# Patient Record
Sex: Male | Born: 1945 | Race: White | Hispanic: No | Marital: Single
Health system: Southern US, Community
[De-identification: ages and names within clinical notes are randomized; demographics above are authoritative.]

## PROBLEM LIST (undated history)

## (undated) DIAGNOSIS — J181 Lobar pneumonia, unspecified organism: Secondary | ICD-10-CM

## (undated) DIAGNOSIS — R1310 Dysphagia, unspecified: Secondary | ICD-10-CM

## (undated) DIAGNOSIS — G9341 Metabolic encephalopathy: Secondary | ICD-10-CM

## (undated) DIAGNOSIS — N170 Acute kidney failure with tubular necrosis: Secondary | ICD-10-CM

## (undated) DIAGNOSIS — J9621 Acute and chronic respiratory failure with hypoxia: Secondary | ICD-10-CM

## (undated) DIAGNOSIS — I63512 Cerebral infarction due to unspecified occlusion or stenosis of left middle cerebral artery: Secondary | ICD-10-CM

---

## 2018-11-07 ENCOUNTER — Other Ambulatory Visit (HOSPITAL_COMMUNITY): Payer: Medicare Other

## 2018-11-07 ENCOUNTER — Inpatient Hospital Stay
Admission: RE | Admit: 2018-11-07 | Discharge: 2018-12-04 | Disposition: A | Discharge status: Skilled Nursing Facility | Payer: Medicare Other | Source: Other Acute Inpatient Hospital | Attending: Internal Medicine | Admitting: Internal Medicine

## 2018-11-07 DIAGNOSIS — G9341 Metabolic encephalopathy: Secondary | ICD-10-CM | POA: Diagnosis present

## 2018-11-07 DIAGNOSIS — J398 Other specified diseases of upper respiratory tract: Secondary | ICD-10-CM

## 2018-11-07 DIAGNOSIS — J9690 Respiratory failure, unspecified, unspecified whether with hypoxia or hypercapnia: Secondary | ICD-10-CM

## 2018-11-07 DIAGNOSIS — R1310 Dysphagia, unspecified: Secondary | ICD-10-CM

## 2018-11-07 DIAGNOSIS — J181 Lobar pneumonia, unspecified organism: Secondary | ICD-10-CM | POA: Diagnosis present

## 2018-11-07 DIAGNOSIS — J189 Pneumonia, unspecified organism: Secondary | ICD-10-CM

## 2018-11-07 DIAGNOSIS — J9621 Acute and chronic respiratory failure with hypoxia: Secondary | ICD-10-CM | POA: Diagnosis present

## 2018-11-07 DIAGNOSIS — I63512 Cerebral infarction due to unspecified occlusion or stenosis of left middle cerebral artery: Secondary | ICD-10-CM | POA: Diagnosis present

## 2018-11-07 DIAGNOSIS — N170 Acute kidney failure with tubular necrosis: Secondary | ICD-10-CM | POA: Diagnosis present

## 2018-11-07 DIAGNOSIS — Z931 Gastrostomy status: Secondary | ICD-10-CM

## 2018-11-07 HISTORY — DX: Dysphagia, unspecified: R13.10

## 2018-11-07 HISTORY — DX: Cerebral infarction due to unspecified occlusion or stenosis of left middle cerebral artery: I63.512

## 2018-11-07 HISTORY — DX: Lobar pneumonia, unspecified organism: J18.1

## 2018-11-07 HISTORY — DX: Metabolic encephalopathy: G93.41

## 2018-11-07 HISTORY — DX: Acute and chronic respiratory failure with hypoxia: J96.21

## 2018-11-07 HISTORY — DX: Acute kidney failure with tubular necrosis: N17.0

## 2018-11-07 LAB — URINALYSIS, ROUTINE W REFLEX MICROSCOPIC
BILIRUBIN URINE: NEGATIVE
Glucose, UA: NEGATIVE mg/dL
Ketones, ur: NEGATIVE mg/dL
LEUKOCYTES UA: NEGATIVE
Nitrite: NEGATIVE
Protein, ur: NEGATIVE mg/dL
Specific Gravity, Urine: 1.012 (ref 1.005–1.030)
pH: 8 (ref 5.0–8.0)

## 2018-11-07 LAB — HEPARIN LEVEL (UNFRACTIONATED)
HEPARIN UNFRACTIONATED: 0.4 [IU]/mL (ref 0.30–0.70)
Heparin Unfractionated: 0.43 IU/mL (ref 0.30–0.70)

## 2018-11-07 LAB — CBC WITH DIFFERENTIAL/PLATELET
ABS IMMATURE GRANULOCYTES: 0.14 10*3/uL — AB (ref 0.00–0.07)
Basophils Absolute: 0 10*3/uL (ref 0.0–0.1)
Basophils Relative: 0 %
Eosinophils Absolute: 0.3 10*3/uL (ref 0.0–0.5)
Eosinophils Relative: 3 %
HCT: 31.3 % — ABNORMAL LOW (ref 39.0–52.0)
Hemoglobin: 10.2 g/dL — ABNORMAL LOW (ref 13.0–17.0)
Immature Granulocytes: 2 %
Lymphocytes Relative: 12 %
Lymphs Abs: 1 10*3/uL (ref 0.7–4.0)
MCH: 32.6 pg (ref 26.0–34.0)
MCHC: 32.6 g/dL (ref 30.0–36.0)
MCV: 100 fL (ref 80.0–100.0)
Monocytes Absolute: 0.7 10*3/uL (ref 0.1–1.0)
Monocytes Relative: 8 %
Neutro Abs: 6.2 10*3/uL (ref 1.7–7.7)
Neutrophils Relative %: 75 %
Platelets: 151 10*3/uL (ref 150–400)
RBC: 3.13 MIL/uL — ABNORMAL LOW (ref 4.22–5.81)
RDW: 13.2 % (ref 11.5–15.5)
WBC: 8.3 10*3/uL (ref 4.0–10.5)
nRBC: 0 % (ref 0.0–0.2)

## 2018-11-07 LAB — PROTIME-INR
INR: 1.19
Prothrombin Time: 15 seconds (ref 11.4–15.2)

## 2018-11-07 LAB — APTT: aPTT: 132 seconds — ABNORMAL HIGH (ref 24–36)

## 2018-11-07 MED ORDER — IOPAMIDOL (ISOVUE-300) INJECTION 61%: 50.0000 mL | Freq: Once | INTRAVENOUS | Status: DC | PRN

## 2018-11-07 MED ORDER — IOPAMIDOL (ISOVUE-300) INJECTION 61%
INTRAVENOUS | Status: AC
  Administered 2018-11-07: 50 mL via GASTROSTOMY
  Filled 2018-11-07: qty 50

## 2018-11-08 DIAGNOSIS — G9341 Metabolic encephalopathy: Secondary | ICD-10-CM

## 2018-11-08 DIAGNOSIS — R1310 Dysphagia, unspecified: Secondary | ICD-10-CM

## 2018-11-08 DIAGNOSIS — J9621 Acute and chronic respiratory failure with hypoxia: Secondary | ICD-10-CM | POA: Diagnosis not present

## 2018-11-08 DIAGNOSIS — I63512 Cerebral infarction due to unspecified occlusion or stenosis of left middle cerebral artery: Secondary | ICD-10-CM

## 2018-11-08 DIAGNOSIS — J181 Lobar pneumonia, unspecified organism: Secondary | ICD-10-CM

## 2018-11-08 DIAGNOSIS — N170 Acute kidney failure with tubular necrosis: Secondary | ICD-10-CM

## 2018-11-08 LAB — COMPREHENSIVE METABOLIC PANEL
ALT: 268 U/L — ABNORMAL HIGH (ref 0–44)
AST: 140 U/L — AB (ref 15–41)
Albumin: 2.4 g/dL — ABNORMAL LOW (ref 3.5–5.0)
Alkaline Phosphatase: 79 U/L (ref 38–126)
Anion gap: 10 (ref 5–15)
BUN: 11 mg/dL (ref 8–23)
CO2: 22 mmol/L (ref 22–32)
Calcium: 8.4 mg/dL — ABNORMAL LOW (ref 8.9–10.3)
Chloride: 103 mmol/L (ref 98–111)
Creatinine, Ser: 0.83 mg/dL (ref 0.61–1.24)
GFR calc Af Amer: >60 mL/min (ref >60)
GFR calc non Af Amer: >60 mL/min (ref >60)
Glucose, Bld: 128 mg/dL — ABNORMAL HIGH (ref 70–99)
Potassium: 4.2 mmol/L (ref 3.5–5.1)
Sodium: 135 mmol/L (ref 135–145)
Total Bilirubin: 0.6 mg/dL (ref 0.3–1.2)
Total Protein: 5.8 g/dL — ABNORMAL LOW (ref 6.5–8.1)

## 2018-11-08 LAB — CBC WITH DIFFERENTIAL/PLATELET
Abs Immature Granulocytes: 0.12 10*3/uL — ABNORMAL HIGH (ref 0.00–0.07)
Basophils Absolute: 0 10*3/uL (ref 0.0–0.1)
Basophils Relative: 0 %
Eosinophils Absolute: 0.2 10*3/uL (ref 0.0–0.5)
Eosinophils Relative: 2 %
HCT: 31.2 % — ABNORMAL LOW (ref 39.0–52.0)
Hemoglobin: 10 g/dL — ABNORMAL LOW (ref 13.0–17.0)
IMMATURE GRANULOCYTES: 1 %
LYMPHS PCT: 10 %
Lymphs Abs: 1 10*3/uL (ref 0.7–4.0)
MCH: 32.3 pg (ref 26.0–34.0)
MCHC: 32.1 g/dL (ref 30.0–36.0)
MCV: 100.6 fL — ABNORMAL HIGH (ref 80.0–100.0)
Monocytes Absolute: 0.8 10*3/uL (ref 0.1–1.0)
Monocytes Relative: 9 %
Neutro Abs: 7.5 10*3/uL (ref 1.7–7.7)
Neutrophils Relative %: 78 %
Platelets: 156 10*3/uL (ref 150–400)
RBC: 3.1 MIL/uL — ABNORMAL LOW (ref 4.22–5.81)
RDW: 13.2 % (ref 11.5–15.5)
WBC: 9.7 10*3/uL (ref 4.0–10.5)
nRBC: 0 % (ref 0.0–0.2)

## 2018-11-08 LAB — BLOOD GAS, ARTERIAL
Acid-Base Excess: 1.7 mmol/L (ref 0.0–2.0)
Bicarbonate: 24.7 mmol/L (ref 20.0–28.0)
FIO2: 28
O2 Saturation: 96.3 %
PEEP: 5 cmH2O
Patient temperature: 98.6
Pressure control: 22 cmH2O
RATE: 12 resp/min
pCO2 arterial: 31.8 mmHg — ABNORMAL LOW (ref 32.0–48.0)
pH, Arterial: 7.501 — ABNORMAL HIGH (ref 7.350–7.450)
pO2, Arterial: 73 mmHg — ABNORMAL LOW (ref 83.0–108.0)

## 2018-11-08 LAB — PROTIME-INR
INR: 1.15
Prothrombin Time: 14.6 seconds (ref 11.4–15.2)

## 2018-11-08 LAB — HEPARIN LEVEL (UNFRACTIONATED): Heparin Unfractionated: 0.28 IU/mL — ABNORMAL LOW (ref 0.30–0.70)

## 2018-11-08 LAB — HEMOGLOBIN A1C
Hgb A1c MFr Bld: 4.8 % (ref 4.8–5.6)
Mean Plasma Glucose: 91.06 mg/dL

## 2018-11-08 LAB — MAGNESIUM: Magnesium: 2.1 mg/dL (ref 1.7–2.4)

## 2018-11-08 LAB — TSH: TSH: 7.869 u[IU]/mL — ABNORMAL HIGH (ref 0.350–4.500)

## 2018-11-08 LAB — PHOSPHORUS: Phosphorus: 4.7 mg/dL — ABNORMAL HIGH (ref 2.5–4.6)

## 2018-11-08 NOTE — Consult Note (Signed)
Pulmonary Critical Care Medicine [facility] [place]  PULMONARY SERVICE  Date of Service: 11/08/2018  PULMONARY CRITICAL CARE CONSULT   Brian Chung  JXB:[id number]  DOB: 07-13-1946   DOA: 11/07/2018  Referring Physician: Carron Curie, MD  HPI: Brian Chung is a 72 y.o. male seen for follow up of Acute on Chronic Respiratory Failure.  Patient is transferred for further management basically has a history of hypothyroidism heart murmur hepatitis C anemia presented to the regional facility with a code stroke.  Patient had apparently been having difficulty controlling his right arm.  He also at the time was noted that he was able to speak however he did not feel right.  In the morning patient woke up and his friends found him flaccid and he was not able to peak.  Patient had a CT scan of the head done that showed ischemic infarct with no mass-effect.  Hospital course was as follows patient was intubated placed on the ventilator.  He is not been able to come off the ventilator since then.  He was intubated apparently on the 26th patient also was found to have a DVT and also was found to have pneumonia.  He was treated with antibiotics for the pneumonia in the form of cefepime.  At the time that he presents here to our facility he was having increased respiratory rate was then some respiratory distress.  Had to be placed on full mechanical ventilation support.  Review of Systems:  ROS performed and is unremarkable other than noted above.  Past Medical History:  Diagnosis Date  . 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 Disease of thyroid gland  . Heart murmur  . Hepatitis  Hep C  . Macrocytic anemia 12/12/2016   Past Surgical History:  Procedure Laterality Date  . Cholecystectomy  . Hernia repair   No Known Allergies  Social History   Socioeconomic History  . Marital status: Single  Spouse name: Not on file  . Number of  children: Not on file  . Years of education: Not on file  . Highest education level: Not on file  Occupational History  . Not on file  Social Needs  . Financial resource strain: Not on file  . Food insecurity  Worry: Not on file  Inability: Not on file  . Transportation needs  Medical: Not on file  Non-medical: Not on file  Tobacco Use  . Smoking status: Current Every Day Smoker  Packs/day: 0.50  Types: Cigarettes  . Smokeless tobacco: Never Used  . Tobacco comment: NRT  Substance and Sexual Activity  . Alcohol use: No  . Drug use: No  . Sexual activity: Never  Lifestyle  . Physical activity  Days per week: Not on file  Minutes per session: Not on file  . Stress: Not on file  Relationships  . Social Multimedia programmer on phone: Not on file  Gets together: Not on file  Attends religious service: Not on file  Active member of club or organization: Not on file  Attends meetings of clubs or organizations: Not on file  Relationship status: Not on file  . Intimate partner violence  Fear of current or ex partner: Not on file  Emotionally abused: Not on file  Physically abused: Not on file  Forced sexual activity: Not on file  Other Topics Concern  . Not on file  Social History Narrative  . Not on file   Family History  Problem Relation Age of Onset  . Stroke Mother    Medications: Reviewed on Rounds  Physical Exam:  Vitals: Temperature 97.3 pulse 67 respiratory 18 blood pressure 108/88 saturations 98%  Ventilator Settings mode ventilation pressure support FiO2 28% tidal volume 516 per support 12 PEEP 5  . General: Comfortable at this time . Eyes: Grossly normal lids, irises & conjunctiva . ENT: grossly tongue is normal . Neck: no obvious mass . Cardiovascular: S1-S2 normal no gallop or rub . Respiratory: Coarse rhonchi expansion is equal . Abdomen: Soft and nontender . Skin: no rash seen on limited exam . Musculoskeletal: not rigid . Psychiatric:unable to  assess . Neurologic: no seizure no involuntary movements         Labs on Admission:  Basic Metabolic Panel: Recent Labs  Lab 11/08/18 0622  NA 135  K 4.2  CL 103  CO2 22  GLUCOSE 128*  BUN 11  CREATININE 0.83  CALCIUM 8.4*  MG 2.1  PHOS 4.7*    Recent Labs  Lab 11/07/18 1541  PHART 7.501*  PCO2ART 31.8*  PO2ART 73.0*  HCO3 24.7  O2SAT 96.3    Liver Function Tests: Recent Labs  Lab 11/08/18 0622  AST 140*  ALT 268*  ALKPHOS 79  BILITOT 0.6  PROT 5.8*  ALBUMIN 2.4*   No results for input(s): LIPASE, AMYLASE in the last 168 hours. No results for input(s): AMMONIA in the last 168 hours.  CBC: Recent Labs  Lab 11/07/18 1459 11/08/18 0622  WBC 8.3 9.7  NEUTROABS 6.2 7.5  HGB 10.2* 10.0*  HCT 31.3* 31.2*  MCV 100.0 100.6*  PLT 151 156    Cardiac Enzymes: No results for input(s): CKTOTAL, CKMB, CKMBINDEX, TROPONINI in the last 168 hours.  BNP (last 3 results) No results for input(s): BNP in the last 8760 hours.  ProBNP (last 3 results) No results for input(s): PROBNP in the last 8760 hours.   Radiological Exams on Admission: Dg Abdomen Peg Tube Location  Result Date: 11/07/2018 CLINICAL DATA:  Confirm PEG placement EXAM: ABDOMEN - 1 VIEW COMPARISON:  None. FINDINGS: 50 mL Isovue 300 administered via indwelling gastrostomy tube. Contrast opacifies the gastric cardia and proximal duodenum, confirming patency and intraluminal placement. No external contrast is visualized. Nonobstructive bowel gas pattern. IMPRESSION: Contrast opacifies the gastric cardia and proximal duodenum, confirming intraluminal placement of the indwelling gastrostomy tube. Electronically Signed   By: Charline BillsSriyesh  Krishnan M.D.   On: 11/07/2018 16:28   Dg Chest Port 1 View  Result Date: 11/07/2018 CLINICAL DATA:  Pneumonia, confirm tracheostomy location after recent transfer. EXAM: PORTABLE CHEST 1 VIEW COMPARISON:  None. FINDINGS: The heart size and mediastinal contours are within  normal limits. Tip of tracheostomy tube is centered over the upper third of the thoracic trachea. The lungs are hyperinflated and there are retrocardiac streaky opacities suspicious for atelectasis and/or minimal pneumonia. Trace right effusion is not excluded accounting for subtle opacity blunting the right lateral costophrenic angle. No overt pulmonary edema or pneumothorax. The visualized skeletal structures are unremarkable. IMPRESSION: Hyperinflated lungs with retrocardiac atelectasis and/or minimal pneumonia. Tracheostomy tube tip in satisfactory position centered over the upper third of the trachea. Electronically Signed   By: Tollie Ethavid  Kwon M.D.   On: 11/07/2018 16:22    Assessment/Plan Active Problems:   Acute on chronic respiratory failure with hypoxia (HCC)   Lobar pneumonia (HCC)   Left acute arterial ischemic stroke, MCA (middle cerebral artery) (HCC)   Dysphagia   Acute metabolic encephalopathy  Acute kidney injury (AKI) with acute tubular necrosis (ATN) (HCC)   1. Acute on chronic respiratory failure with hypoxia patient is on a wean with pressure support mode currently is on now 28% FiO2 has excellent tidal volumes right now is on pressure support of 12 PEEP 5 we will continue with the current settings and we will continue to monitor. 2. Lobar pneumonia treated patient was on cefepime he was found to have Pseudomonas species.  The most current chest x-ray was showing minimal pneumonia. 3. Acute stroke left middle cerebral artery patient right now is nonverbal he is going to need ongoing evaluation and physical therapy. 4. Dysphagia once able will need a swallowing assessment. 5. Encephalopathy likely secondary to the above 6. Acute kidney injury on arrival his BUN/creatinine appears to be normal we will continue to monitor this very closely.  I have personally seen and evaluated the patient, evaluated laboratory and imaging results, formulated the assessment and plan and placed  orders. The Patient requires high complexity decision making for assessment and support.  Case was discussed on Rounds with the Respiratory Therapy Staff Time Spent 70minutes  Yevonne PaxSaadat A Elohim Brune, MD [facility] Pulmonary Critical Care Medicine Sleep Medicine

## 2018-11-09 DIAGNOSIS — N170 Acute kidney failure with tubular necrosis: Secondary | ICD-10-CM | POA: Diagnosis not present

## 2018-11-09 DIAGNOSIS — G9341 Metabolic encephalopathy: Secondary | ICD-10-CM | POA: Diagnosis not present

## 2018-11-09 DIAGNOSIS — R1310 Dysphagia, unspecified: Secondary | ICD-10-CM | POA: Diagnosis not present

## 2018-11-09 DIAGNOSIS — J9621 Acute and chronic respiratory failure with hypoxia: Secondary | ICD-10-CM | POA: Diagnosis not present

## 2018-11-09 LAB — CBC
HCT: 28.9 % — ABNORMAL LOW (ref 39.0–52.0)
Hemoglobin: 9.6 g/dL — ABNORMAL LOW (ref 13.0–17.0)
MCH: 33.8 pg (ref 26.0–34.0)
MCHC: 33.2 g/dL (ref 30.0–36.0)
MCV: 101.8 fL — ABNORMAL HIGH (ref 80.0–100.0)
Platelets: 170 10*3/uL (ref 150–400)
RBC: 2.84 MIL/uL — ABNORMAL LOW (ref 4.22–5.81)
RDW: 13.2 % (ref 11.5–15.5)
WBC: 14.6 10*3/uL — AB (ref 4.0–10.5)
nRBC: 0 % (ref 0.0–0.2)

## 2018-11-09 LAB — BASIC METABOLIC PANEL
ANION GAP: 10 (ref 5–15)
BUN: 13 mg/dL (ref 8–23)
CALCIUM: 8.1 mg/dL — AB (ref 8.9–10.3)
CO2: 22 mmol/L (ref 22–32)
Chloride: 102 mmol/L (ref 98–111)
Creatinine, Ser: 0.74 mg/dL (ref 0.61–1.24)
GFR calc non Af Amer: >60 mL/min (ref >60)
Glucose, Bld: 155 mg/dL — ABNORMAL HIGH (ref 70–99)
Potassium: 4.2 mmol/L (ref 3.5–5.1)
Sodium: 134 mmol/L — ABNORMAL LOW (ref 135–145)

## 2018-11-09 LAB — PROTIME-INR
INR: 1.23
Prothrombin Time: 15.4 seconds — ABNORMAL HIGH (ref 11.4–15.2)

## 2018-11-09 LAB — URINE CULTURE: Culture: NO GROWTH

## 2018-11-09 LAB — MAGNESIUM: Magnesium: 2 mg/dL (ref 1.7–2.4)

## 2018-11-09 LAB — PHOSPHORUS: Phosphorus: 3.6 mg/dL (ref 2.5–4.6)

## 2018-11-09 NOTE — Progress Notes (Addendum)
[facility] [place]   PULMONARY CRITICAL CARE SERVICE  PROGRESS NOTE  Date of Service: 11/09/2018  Brian Clinesarnest F Seib  ZOX:[id number]  DOB: 10/11/1946   DOA: 11/07/2018  Referring Physician: Carron CurieAli Hijazi, MD  HPI: Brian Chung is a 72 y.o. male seen for follow up of Acute on Chronic Respiratory Failure.  At this time patient is weaning on pressure support mode has been on 28% oxygen tolerating it fairly well.  Medications: Reviewed on Rounds  Physical Exam:  Vitals: Temperature 98.7 pulse 81 respiratory 18 blood pressure 128/62 saturations 99%  Ventilator Settings patient is on pressure support FiO2 28% tidal volume 4 9 pressure support 12 PEEP 5  . General: Comfortable at this time . Eyes: Grossly normal lids, irises & conjunctiva . ENT: grossly tongue is normal . Neck: no obvious mass . Cardiovascular: S1 S2 normal no gallop . Respiratory: No rhonchi no rales are noted at this time . Abdomen: soft . Skin: no rash seen on limited exam . Musculoskeletal: not rigid . Psychiatric:unable to assess . Neurologic: no seizure no involuntary movements         Lab Data:   Basic Metabolic Panel: Recent Labs  Lab 11/08/18 0622 11/09/18 0448  NA 135 134*  K 4.2 4.2  CL 103 102  CO2 22 22  GLUCOSE 128* 155*  BUN 11 13  CREATININE 0.83 0.74  CALCIUM 8.4* 8.1*  MG 2.1 2.0  PHOS 4.7* 3.6    ABG: Recent Labs  Lab 11/07/18 1541  PHART 7.501*  PCO2ART 31.8*  PO2ART 73.0*  HCO3 24.7  O2SAT 96.3    Liver Function Tests: Recent Labs  Lab 11/08/18 0622  AST 140*  ALT 268*  ALKPHOS 79  BILITOT 0.6  PROT 5.8*  ALBUMIN 2.4*   No results for input(s): LIPASE, AMYLASE in the last 168 hours. No results for input(s): AMMONIA in the last 168 hours.  CBC: Recent Labs  Lab 11/07/18 1459 11/08/18 0622 11/09/18 0448  WBC 8.3 9.7 14.6*  NEUTROABS 6.2 7.5  --   HGB 10.2* 10.0* 9.6*  HCT 31.3* 31.2* 28.9*  MCV 100.0  100.6* 101.8*  PLT 151 156 170    Cardiac Enzymes: No results for input(s): CKTOTAL, CKMB, CKMBINDEX, TROPONINI in the last 168 hours.  BNP (last 3 results) No results for input(s): BNP in the last 8760 hours.  ProBNP (last 3 results) No results for input(s): PROBNP in the last 8760 hours.  Radiological Exams: Dg Abdomen Peg Tube Location  Result Date: 11/07/2018 CLINICAL DATA:  Confirm PEG placement EXAM: ABDOMEN - 1 VIEW COMPARISON:  None. FINDINGS: 50 mL Isovue 300 administered via indwelling gastrostomy tube. Contrast opacifies the gastric cardia and proximal duodenum, confirming patency and intraluminal placement. No external contrast is visualized. Nonobstructive bowel gas pattern. IMPRESSION: Contrast opacifies the gastric cardia and proximal duodenum, confirming intraluminal placement of the indwelling gastrostomy tube. Electronically Signed   By: Charline BillsSriyesh  Krishnan M.D.   On: 11/07/2018 16:28   Dg Chest Port 1 View  Result Date: 11/07/2018 CLINICAL DATA:  Pneumonia, confirm tracheostomy location after recent transfer. EXAM: PORTABLE CHEST 1 VIEW COMPARISON:  None. FINDINGS: The heart size and mediastinal contours are within normal limits. Tip of tracheostomy tube is centered over the upper third of the thoracic trachea. The lungs are hyperinflated and there are retrocardiac streaky opacities suspicious for atelectasis and/or minimal pneumonia. Trace right effusion is not excluded accounting for subtle opacity blunting the right lateral costophrenic angle.  No overt pulmonary edema or pneumothorax. The visualized skeletal structures are unremarkable. IMPRESSION: Hyperinflated lungs with retrocardiac atelectasis and/or minimal pneumonia. Tracheostomy tube tip in satisfactory position centered over the upper third of the trachea. Electronically Signed   By: Tollie Ethavid  Kwon M.D.   On: 11/07/2018 16:22    Assessment/Plan Active Problems:   Acute on chronic respiratory failure with hypoxia  (HCC)   Lobar pneumonia (HCC)   Left acute arterial ischemic stroke, MCA (middle cerebral artery) (HCC)   Dysphagia   Acute metabolic encephalopathy   Acute kidney injury (AKI) with acute tubular necrosis (ATN) (HCC)   1. Acute on chronic respiratory failure with hypoxia doing fairly well with the pressure support wean the goal was for 16 hours so far tolerating well.  We will continue with full supportive care titrate oxygen as tolerated continue with aggressive pulmonary toilet 2. Lobar pneumonia we will continue with treatment continue supportive care.  Follow x-rays. 3. Acute ischemic stroke middle cerebral artery patient will be followed by physical therapy if able to tolerate. 4. Dysphagia will need to see speech therapy 5. Metabolic encephalopathy grossly unchanged 6. Acute kidney injury labs have normalized we will continue to monitor   I have personally seen and evaluated the patient, evaluated laboratory and imaging results, formulated the assessment and plan and placed orders.  Time spent 35 minutes reviewing the record discussion with the family members The Patient requires high complexity decision making for assessment and support.  Case was discussed on Rounds with the Respiratory Therapy Staff  Yevonne PaxSaadat A Khan, MD [facility] Pulmonary Critical Care Medicine Sleep Medicine

## 2018-11-10 DIAGNOSIS — J9621 Acute and chronic respiratory failure with hypoxia: Secondary | ICD-10-CM | POA: Diagnosis not present

## 2018-11-10 DIAGNOSIS — G9341 Metabolic encephalopathy: Secondary | ICD-10-CM | POA: Diagnosis not present

## 2018-11-10 DIAGNOSIS — R1310 Dysphagia, unspecified: Secondary | ICD-10-CM | POA: Diagnosis not present

## 2018-11-10 DIAGNOSIS — N170 Acute kidney failure with tubular necrosis: Secondary | ICD-10-CM | POA: Diagnosis not present

## 2018-11-10 LAB — CBC
HCT: 25.5 % — ABNORMAL LOW (ref 39.0–52.0)
Hemoglobin: 8.4 g/dL — ABNORMAL LOW (ref 13.0–17.0)
MCH: 33.9 pg (ref 26.0–34.0)
MCHC: 32.9 g/dL (ref 30.0–36.0)
MCV: 102.8 fL — ABNORMAL HIGH (ref 80.0–100.0)
PLATELETS: 162 10*3/uL (ref 150–400)
RBC: 2.48 MIL/uL — AB (ref 4.22–5.81)
RDW: 13.7 % (ref 11.5–15.5)
WBC: 13.5 10*3/uL — ABNORMAL HIGH (ref 4.0–10.5)
nRBC: 0 % (ref 0.0–0.2)

## 2018-11-10 LAB — BASIC METABOLIC PANEL
Anion gap: 7 (ref 5–15)
BUN: 17 mg/dL (ref 8–23)
CO2: 27 mmol/L (ref 22–32)
Calcium: 8 mg/dL — ABNORMAL LOW (ref 8.9–10.3)
Chloride: 101 mmol/L (ref 98–111)
Creatinine, Ser: 0.83 mg/dL (ref 0.61–1.24)
GFR calc Af Amer: >60 mL/min (ref >60)
GLUCOSE: 128 mg/dL — AB (ref 70–99)
Potassium: 4.4 mmol/L (ref 3.5–5.1)
Sodium: 135 mmol/L (ref 135–145)

## 2018-11-10 LAB — MAGNESIUM: Magnesium: 2.1 mg/dL (ref 1.7–2.4)

## 2018-11-10 LAB — PHOSPHORUS: Phosphorus: 3.2 mg/dL (ref 2.5–4.6)

## 2018-11-10 LAB — HEPARIN LEVEL (UNFRACTIONATED)
Heparin Unfractionated: 0.11 IU/mL — ABNORMAL LOW (ref 0.30–0.70)
Heparin Unfractionated: 0.11 IU/mL — ABNORMAL LOW (ref 0.30–0.70)

## 2018-11-10 NOTE — Progress Notes (Signed)
[facility] [place]   PULMONARY CRITICAL CARE SERVICE  PROGRESS NOTE  Date of Service: 11/10/2018  Brian Clinesarnest F Erb  GNF:[id number]  DOB: 01/09/1946   DOA: 11/07/2018  Referring Physician: Carron CurieAli Hijazi, MD  HPI: Brian Chung is a 72 y.o. male seen for follow up of Acute on Chronic Respiratory Failure.  Patient right now is on pressure support was on T collar earlier.  Was able to complete about 2 hours  Medications: Reviewed on Rounds  Physical Exam:  Vitals: Temperature 97.2 pulse 99 respiratory 18 blood pressure 107/58 saturations 98%  Ventilator Settings mode ventilation pressure support FiO2 28% tidal volume 575 pressure support 12 PEEP 5  . General: Comfortable at this time . Eyes: Grossly normal lids, irises & conjunctiva . ENT: grossly tongue is normal . Neck: no obvious mass . Cardiovascular: S1 S2 normal no gallop . Respiratory: No rhonchi no rales are noted . Abdomen: soft . Skin: no rash seen on limited exam . Musculoskeletal: not rigid . Psychiatric:unable to assess . Neurologic: no seizure no involuntary movements         Lab Data:   Basic Metabolic Panel: Recent Labs  Lab 11/08/18 0622 11/09/18 0448 11/10/18 0657  NA 135 134* 135  K 4.2 4.2 4.4  CL 103 102 101  CO2 22 22 27   GLUCOSE 128* 155* 128*  BUN 11 13 17   CREATININE 0.83 0.74 0.83  CALCIUM 8.4* 8.1* 8.0*  MG 2.1 2.0 2.1  PHOS 4.7* 3.6 3.2    ABG: Recent Labs  Lab 11/07/18 1541  PHART 7.501*  PCO2ART 31.8*  PO2ART 73.0*  HCO3 24.7  O2SAT 96.3    Liver Function Tests: Recent Labs  Lab 11/08/18 0622  AST 140*  ALT 268*  ALKPHOS 79  BILITOT 0.6  PROT 5.8*  ALBUMIN 2.4*   No results for input(s): LIPASE, AMYLASE in the last 168 hours. No results for input(s): AMMONIA in the last 168 hours.  CBC: Recent Labs  Lab 11/07/18 1459 11/08/18 0622 11/09/18 0448 11/10/18 0657  WBC 8.3 9.7 14.6* 13.5*  NEUTROABS 6.2 7.5  --    --   HGB 10.2* 10.0* 9.6* 8.4*  HCT 31.3* 31.2* 28.9* 25.5*  MCV 100.0 100.6* 101.8* 102.8*  PLT 151 156 170 162    Cardiac Enzymes: No results for input(s): CKTOTAL, CKMB, CKMBINDEX, TROPONINI in the last 168 hours.  BNP (last 3 results) No results for input(s): BNP in the last 8760 hours.  ProBNP (last 3 results) No results for input(s): PROBNP in the last 8760 hours.  Radiological Exams: No results found.  Assessment/Plan Active Problems:   Acute on chronic respiratory failure with hypoxia (HCC)   Lobar pneumonia (HCC)   Left acute arterial ischemic stroke, MCA (middle cerebral artery) (HCC)   Dysphagia   Acute metabolic encephalopathy   Acute kidney injury (AKI) with acute tubular necrosis (ATN) (HCC)   1. Acute on chronic respiratory failure with hypoxia we will continue to advance the wean as per protocol.  Patient did 2 hours of T collar.  Will reassess again tomorrow and advance further. 2. Lobar pneumonia treated with antibiotics we will continue with supportive care. 3. Left acute stroke we will continue with physical therapy as tolerated. 4. Dysphagia follow-up with speech therapy 5. Acute metabolic encephalopathy grossly unchanged 6. Acute renal failure labs of stabilized   I have personally seen and evaluated the patient, evaluated laboratory and imaging results, formulated the assessment and plan and  placed orders. The Patient requires high complexity decision making for assessment and support.  Case was discussed on Rounds with the Respiratory Therapy Staff  Allyne Gee, MD [facility] Pulmonary Critical Care Medicine Sleep Medicine

## 2018-11-11 ENCOUNTER — Encounter: Payer: Self-pay | Admitting: Internal Medicine

## 2018-11-11 ENCOUNTER — Other Ambulatory Visit (HOSPITAL_COMMUNITY): Payer: Medicare Other

## 2018-11-11 DIAGNOSIS — J9621 Acute and chronic respiratory failure with hypoxia: Secondary | ICD-10-CM | POA: Diagnosis not present

## 2018-11-11 DIAGNOSIS — N170 Acute kidney failure with tubular necrosis: Secondary | ICD-10-CM | POA: Diagnosis not present

## 2018-11-11 DIAGNOSIS — R1310 Dysphagia, unspecified: Secondary | ICD-10-CM

## 2018-11-11 DIAGNOSIS — G9341 Metabolic encephalopathy: Secondary | ICD-10-CM | POA: Diagnosis present

## 2018-11-11 DIAGNOSIS — J181 Lobar pneumonia, unspecified organism: Secondary | ICD-10-CM | POA: Diagnosis present

## 2018-11-11 DIAGNOSIS — I63512 Cerebral infarction due to unspecified occlusion or stenosis of left middle cerebral artery: Secondary | ICD-10-CM | POA: Diagnosis present

## 2018-11-11 LAB — CBC
HCT: 26.3 % — ABNORMAL LOW (ref 39.0–52.0)
Hemoglobin: 8.6 g/dL — ABNORMAL LOW (ref 13.0–17.0)
MCH: 32.6 pg (ref 26.0–34.0)
MCHC: 32.7 g/dL (ref 30.0–36.0)
MCV: 99.6 fL (ref 80.0–100.0)
Platelets: 174 10*3/uL (ref 150–400)
RBC: 2.64 MIL/uL — ABNORMAL LOW (ref 4.22–5.81)
RDW: 13.4 % (ref 11.5–15.5)
WBC: 19 10*3/uL — ABNORMAL HIGH (ref 4.0–10.5)
nRBC: 0 % (ref 0.0–0.2)

## 2018-11-11 LAB — PROTIME-INR
INR: 1.34
Prothrombin Time: 16.4 seconds — ABNORMAL HIGH (ref 11.4–15.2)

## 2018-11-11 LAB — BASIC METABOLIC PANEL
ANION GAP: 9 (ref 5–15)
BUN: 19 mg/dL (ref 8–23)
CALCIUM: 8.1 mg/dL — AB (ref 8.9–10.3)
CO2: 23 mmol/L (ref 22–32)
Chloride: 101 mmol/L (ref 98–111)
Creatinine, Ser: 0.7 mg/dL (ref 0.61–1.24)
GFR calc Af Amer: >60 mL/min (ref >60)
Glucose, Bld: 158 mg/dL — ABNORMAL HIGH (ref 70–99)
Potassium: 4.1 mmol/L (ref 3.5–5.1)
Sodium: 133 mmol/L — ABNORMAL LOW (ref 135–145)

## 2018-11-11 LAB — MAGNESIUM: Magnesium: 2.1 mg/dL (ref 1.7–2.4)

## 2018-11-11 LAB — HEPARIN LEVEL (UNFRACTIONATED)
Heparin Unfractionated: 0.46 IU/mL (ref 0.30–0.70)
Heparin Unfractionated: 0.29 IU/mL — ABNORMAL LOW (ref 0.30–0.70)
Heparin Unfractionated: 0.39 IU/mL (ref 0.30–0.70)

## 2018-11-11 LAB — PHOSPHORUS: Phosphorus: 3.1 mg/dL (ref 2.5–4.6)

## 2018-11-11 MED ORDER — PAROXETINE HCL 20 MG PO TABS
40.00 | ORAL_TABLET | ORAL | Status: DC
Start: 2018-11-08 — End: 2018-11-11

## 2018-11-11 MED ORDER — ISOLYTE-S PH 7.4 IV SOLN
50.00 | INTRAVENOUS | Status: DC
Start: ? — End: 2018-11-11

## 2018-11-11 MED ORDER — ACETAMINOPHEN 325 MG PO TABS
650.00 | ORAL_TABLET | ORAL | Status: DC
Start: ? — End: 2018-11-11

## 2018-11-11 MED ORDER — ASPIRIN 81 MG PO CHEW
81.00 | CHEWABLE_TABLET | ORAL | Status: DC
Start: 2018-11-08 — End: 2018-11-11

## 2018-11-11 MED ORDER — PRAVASTATIN SODIUM 20 MG PO TABS
20.00 | ORAL_TABLET | ORAL | Status: DC
Start: 2018-11-07 — End: 2018-11-11

## 2018-11-11 MED ORDER — PNEUMOCOCCAL 13-VAL CONJ VACC IM SUSP
0.50 | INTRAMUSCULAR | Status: DC
Start: ? — End: 2018-11-11

## 2018-11-11 MED ORDER — ALTEPLASE 2 MG IJ SOLR
2.00 | INTRAMUSCULAR | Status: DC
Start: ? — End: 2018-11-11

## 2018-11-11 MED ORDER — NICOTINE 21 MG/24HR TD PT24
1.00 | MEDICATED_PATCH | TRANSDERMAL | Status: DC
Start: 2018-11-08 — End: 2018-11-11

## 2018-11-11 MED ORDER — NOREPINEPHRINE-SODIUM CHLORIDE 8-0.9 MG/250ML-% IV SOLN
2.00 | INTRAVENOUS | Status: DC
Start: ? — End: 2018-11-11

## 2018-11-11 MED ORDER — ONDANSETRON HCL 4 MG/2ML IJ SOLN
4.00 | INTRAMUSCULAR | Status: DC
Start: ? — End: 2018-11-11

## 2018-11-11 MED ORDER — CHLORHEXIDINE GLUCONATE 0.12 % MT SOLN
15.00 | OROMUCOSAL | Status: DC
Start: 2018-11-07 — End: 2018-11-11

## 2018-11-11 MED ORDER — SODIUM CHLORIDE 0.9 % IV SOLN
20.00 | INTRAVENOUS | Status: DC
Start: ? — End: 2018-11-11

## 2018-11-11 MED ORDER — WARFARIN SODIUM 5 MG PO TABS
5.00 | ORAL_TABLET | ORAL | Status: DC
Start: ? — End: 2018-11-11

## 2018-11-11 MED ORDER — DM-GUAIFENESIN ER 30-600 MG PO TB12
1.00 | ORAL_TABLET | ORAL | Status: DC
Start: ? — End: 2018-11-11

## 2018-11-11 MED ORDER — WARFARIN SODIUM 1 MG PO TABS
ORAL_TABLET | ORAL | Status: DC
Start: ? — End: 2018-11-11

## 2018-11-11 MED ORDER — ACETAMINOPHEN 650 MG RE SUPP
650.00 | RECTAL | Status: DC
Start: ? — End: 2018-11-11

## 2018-11-11 MED ORDER — HEPARIN SOD (PORCINE) IN D5W 100 UNIT/ML IV SOLN
12.00 | INTRAVENOUS | Status: DC
Start: ? — End: 2018-11-11

## 2018-11-11 MED ORDER — LEVOTHYROXINE SODIUM 75 MCG PO TABS
75.00 | ORAL_TABLET | ORAL | Status: DC
Start: 2018-11-07 — End: 2018-11-11

## 2018-11-11 MED ORDER — GENERIC EXTERNAL MEDICATION
2.00 | Status: DC
Start: 2018-11-07 — End: 2018-11-11

## 2018-11-11 MED ORDER — WARFARIN SODIUM 7.5 MG PO TABS
7.50 | ORAL_TABLET | ORAL | Status: DC
Start: ? — End: 2018-11-11

## 2018-11-11 MED ORDER — GENERIC EXTERNAL MEDICATION
10.00 | Status: DC
Start: ? — End: 2018-11-11

## 2018-11-11 MED ORDER — ALUM & MAG HYDROXIDE-SIMETH 200-200-20 MG/5ML PO SUSP
15.00 | ORAL | Status: DC
Start: ? — End: 2018-11-11

## 2018-11-11 MED ORDER — MELATONIN 3 MG PO TABS
3.00 | ORAL_TABLET | ORAL | Status: DC
Start: 2018-11-07 — End: 2018-11-11

## 2018-11-11 MED ORDER — GENERIC EXTERNAL MEDICATION
3.00 | Status: DC
Start: ? — End: 2018-11-11

## 2018-11-11 MED ORDER — SODIUM CHLORIDE 0.9 % IV SOLN
10.00 | INTRAVENOUS | Status: DC
Start: ? — End: 2018-11-11

## 2018-11-11 MED ORDER — CALCIUM CARBONATE ANTACID 500 MG PO CHEW
1000.00 | CHEWABLE_TABLET | ORAL | Status: DC
Start: ? — End: 2018-11-11

## 2018-11-11 NOTE — Progress Notes (Signed)
[facility] [place]   PULMONARY CRITICAL CARE SERVICE  PROGRESS NOTE  Date of Service: 11/11/2018  Brian Chung  ZOX:[id number]  DOB: 09/20/1946   DOA: 11/07/2018  Referring Physician: Carron CurieAli Hijazi, MD  HPI: Brian Clinesarnest F Brabson is a 72 y.o. male seen for follow up of Acute on Chronic Respiratory Failure.  Patient is being seen for respiratory failure weaning on T collar until about 8 hours looks like he is tolerating it fairly well  Medications: Reviewed on Rounds  Physical Exam:  Vitals: Temperature 98.5 pulse 81 respiratory 18 blood pressure 132/64 saturations 98%  Ventilator Settings off the ventilator on T collar FiO2 28%  . General: Comfortable at this time . Eyes: Grossly normal lids, irises & conjunctiva . ENT: grossly tongue is normal . Neck: no obvious mass . Cardiovascular: S1 S2 normal no gallop . Respiratory: No rhonchi no rales are noted at this time . Abdomen: soft . Skin: no rash seen on limited exam . Musculoskeletal: not rigid . Psychiatric:unable to assess . Neurologic: no seizure no involuntary movements         Lab Data:   Basic Metabolic Panel: Recent Labs  Lab 11/08/18 0622 11/09/18 0448 11/10/18 0657 11/11/18 0532  NA 135 134* 135 133*  K 4.2 4.2 4.4 4.1  CL 103 102 101 101  CO2 22 22 27 23   GLUCOSE 128* 155* 128* 158*  BUN 11 13 17 19   CREATININE 0.83 0.74 0.83 0.70  CALCIUM 8.4* 8.1* 8.0* 8.1*  MG 2.1 2.0 2.1 2.1  PHOS 4.7* 3.6 3.2 3.1    ABG: Recent Labs  Lab 11/07/18 1541  PHART 7.501*  PCO2ART 31.8*  PO2ART 73.0*  HCO3 24.7  O2SAT 96.3    Liver Function Tests: Recent Labs  Lab 11/08/18 0622  AST 140*  ALT 268*  ALKPHOS 79  BILITOT 0.6  PROT 5.8*  ALBUMIN 2.4*   No results for input(s): LIPASE, AMYLASE in the last 168 hours. No results for input(s): AMMONIA in the last 168 hours.  CBC: Recent Labs  Lab 11/07/18 1459 11/08/18 0622 11/09/18 0448 11/10/18 0657  11/11/18 0532  WBC 8.3 9.7 14.6* 13.5* 19.0*  NEUTROABS 6.2 7.5  --   --   --   HGB 10.2* 10.0* 9.6* 8.4* 8.6*  HCT 31.3* 31.2* 28.9* 25.5* 26.3*  MCV 100.0 100.6* 101.8* 102.8* 99.6  PLT 151 156 170 162 174    Cardiac Enzymes: No results for input(s): CKTOTAL, CKMB, CKMBINDEX, TROPONINI in the last 168 hours.  BNP (last 3 results) No results for input(s): BNP in the last 8760 hours.  ProBNP (last 3 results) No results for input(s): PROBNP in the last 8760 hours.  Radiological Exams: Dg Chest Port 1 View  Result Date: 11/11/2018 CLINICAL DATA:  Pneumonia.  Respiratory failure. EXAM: PORTABLE CHEST 1 VIEW COMPARISON:  11/07/2018. FINDINGS: Tracheostomy tube and left PICC line stable position. Heart size normal. Mild bibasilar atelectasis, improved from prior exam. Small bilateral pleural effusions. No pneumothorax. IMPRESSION: 1. Tracheostomy tube and left PICC line stable position. 2. Mild bibasilar atelectasis, improved from prior exam. Small bilateral pleural effusions. Electronically Signed   By: Maisie Fushomas  Register   On: 11/11/2018 06:49    Assessment/Plan Active Problems:   Acute on chronic respiratory failure with hypoxia (HCC)   Lobar pneumonia (HCC)   Left acute arterial ischemic stroke, MCA (middle cerebral artery) (HCC)   Dysphagia   Acute metabolic encephalopathy   Acute kidney injury (AKI) with  acute tubular necrosis (ATN) (HCC)   1. Acute on chronic respiratory failure with hypoxia we will continue with weaning on T collar as mentioned the goal is for 8 hours continue secretion management pulmonary toilet. 2. Lobar pneumonia clinically improved has some basilar atelectasis noted. 3. Dysphagia improving hopefully then speech therapy can continue to follow. 4. Acute stroke continue with present management. 5. Metabolic encephalopathy grossly unchanged 6. Acute renal failure stabilized labs   I have personally seen and evaluated the patient, evaluated laboratory and  imaging results, formulated the assessment and plan and placed orders. The Patient requires high complexity decision making for assessment and support.  Case was discussed on Rounds with the Respiratory Therapy Staff  Yevonne PaxSaadat A Reagann Dolce, MD [facility] Pulmonary Critical Care Medicine Sleep Medicine

## 2018-11-12 DIAGNOSIS — R1310 Dysphagia, unspecified: Secondary | ICD-10-CM | POA: Diagnosis not present

## 2018-11-12 DIAGNOSIS — J9621 Acute and chronic respiratory failure with hypoxia: Secondary | ICD-10-CM | POA: Diagnosis not present

## 2018-11-12 DIAGNOSIS — N170 Acute kidney failure with tubular necrosis: Secondary | ICD-10-CM | POA: Diagnosis not present

## 2018-11-12 DIAGNOSIS — G9341 Metabolic encephalopathy: Secondary | ICD-10-CM | POA: Diagnosis not present

## 2018-11-12 LAB — HEPARIN LEVEL (UNFRACTIONATED)
Heparin Unfractionated: 0.14 IU/mL — ABNORMAL LOW (ref 0.30–0.70)
Heparin Unfractionated: 0.3 IU/mL (ref 0.30–0.70)
Heparin Unfractionated: 0.11 IU/mL — ABNORMAL LOW (ref 0.30–0.70)
Heparin Unfractionated: 0.38 IU/mL (ref 0.30–0.70)

## 2018-11-12 LAB — PROTIME-INR
INR: 1.67
Prothrombin Time: 19.5 seconds — ABNORMAL HIGH (ref 11.4–15.2)

## 2018-11-12 NOTE — Progress Notes (Signed)
[facility] [place]   PULMONARY CRITICAL CARE SERVICE  PROGRESS NOTE  Date of Service: 11/12/2018  Brian Chung  WUJ:[id number]  DOB: 09/02/1946   DOA: 11/07/2018  Referring Physician: Carron CurieAli Hijazi, MD  HPI: Brian Chung is a 72 y.o. male seen for follow up of Acute on Chronic Respiratory Failure.  Patient is on T collar the goal is for 12 hours today  Medications: Reviewed on Rounds  Physical Exam:  Vitals: Temperature 97.0 pulse 80 respiratory rate 18 blood pressure 125/65 saturations 99%  Ventilator Settings off the ventilator on T collar  . General: Comfortable at this time . Eyes: Grossly normal lids, irises & conjunctiva . ENT: grossly tongue is normal . Neck: no obvious mass . Cardiovascular: S1 S2 normal no gallop . Respiratory: No rhonchi or rales are noted . Abdomen: soft . Skin: no rash seen on limited exam . Musculoskeletal: not rigid . Psychiatric:unable to assess . Neurologic: no seizure no involuntary movements         Lab Data:   Basic Metabolic Panel: Recent Labs  Lab 11/08/18 0622 11/09/18 0448 11/10/18 0657 11/11/18 0532  NA 135 134* 135 133*  K 4.2 4.2 4.4 4.1  CL 103 102 101 101  CO2 22 22 27 23   GLUCOSE 128* 155* 128* 158*  BUN 11 13 17 19   CREATININE 0.83 0.74 0.83 0.70  CALCIUM 8.4* 8.1* 8.0* 8.1*  MG 2.1 2.0 2.1 2.1  PHOS 4.7* 3.6 3.2 3.1    ABG: Recent Labs  Lab 11/07/18 1541  PHART 7.501*  PCO2ART 31.8*  PO2ART 73.0*  HCO3 24.7  O2SAT 96.3    Liver Function Tests: Recent Labs  Lab 11/08/18 0622  AST 140*  ALT 268*  ALKPHOS 79  BILITOT 0.6  PROT 5.8*  ALBUMIN 2.4*   No results for input(s): LIPASE, AMYLASE in the last 168 hours. No results for input(s): AMMONIA in the last 168 hours.  CBC: Recent Labs  Lab 11/07/18 1459 11/08/18 0622 11/09/18 0448 11/10/18 0657 11/11/18 0532  WBC 8.3 9.7 14.6* 13.5* 19.0*  NEUTROABS 6.2 7.5  --   --   --   HGB 10.2*  10.0* 9.6* 8.4* 8.6*  HCT 31.3* 31.2* 28.9* 25.5* 26.3*  MCV 100.0 100.6* 101.8* 102.8* 99.6  PLT 151 156 170 162 174    Cardiac Enzymes: No results for input(s): CKTOTAL, CKMB, CKMBINDEX, TROPONINI in the last 168 hours.  BNP (last 3 results) No results for input(s): BNP in the last 8760 hours.  ProBNP (last 3 results) No results for input(s): PROBNP in the last 8760 hours.  Radiological Exams: Dg Chest Port 1 View  Result Date: 11/11/2018 CLINICAL DATA:  Pneumonia.  Respiratory failure. EXAM: PORTABLE CHEST 1 VIEW COMPARISON:  11/07/2018. FINDINGS: Tracheostomy tube and left PICC line stable position. Heart size normal. Mild bibasilar atelectasis, improved from prior exam. Small bilateral pleural effusions. No pneumothorax. IMPRESSION: 1. Tracheostomy tube and left PICC line stable position. 2. Mild bibasilar atelectasis, improved from prior exam. Small bilateral pleural effusions. Electronically Signed   By: Maisie Fushomas  Register   On: 11/11/2018 06:49    Assessment/Plan Active Problems:   Acute on chronic respiratory failure with hypoxia (HCC)   Lobar pneumonia (HCC)   Left acute arterial ischemic stroke, MCA (middle cerebral artery) (HCC)   Dysphagia   Acute metabolic encephalopathy   Acute kidney injury (AKI) with acute tubular necrosis (ATN) (HCC)   1. Acute on chronic respiratory failure with hypoxia  we will continue weaning on T collar titrate oxygen continue pulmonary toilet 2. Lobar pneumonia treated we will continue to monitor 3. Dysphagia at baseline 4. Metabolic encephalopathy unchanged 5. Acute tubular necrosis follow-up on labs 6. Acute stroke rehab physical therapy as tolerated   I have personally seen and evaluated the patient, evaluated laboratory and imaging results, formulated the assessment and plan and placed orders. The Patient requires high complexity decision making for assessment and support.  Case was discussed on Rounds with the Respiratory Therapy  Staff  Yevonne PaxSaadat A Chrisette Man, MD [facility] Pulmonary Critical Care Medicine Sleep Medicine

## 2018-11-13 DIAGNOSIS — J9621 Acute and chronic respiratory failure with hypoxia: Secondary | ICD-10-CM | POA: Diagnosis not present

## 2018-11-13 DIAGNOSIS — R1310 Dysphagia, unspecified: Secondary | ICD-10-CM | POA: Diagnosis not present

## 2018-11-13 DIAGNOSIS — N170 Acute kidney failure with tubular necrosis: Secondary | ICD-10-CM | POA: Diagnosis not present

## 2018-11-13 DIAGNOSIS — G9341 Metabolic encephalopathy: Secondary | ICD-10-CM | POA: Diagnosis not present

## 2018-11-13 LAB — PROTIME-INR
INR: 2.34
Prothrombin Time: 25.4 seconds — ABNORMAL HIGH (ref 11.4–15.2)

## 2018-11-13 LAB — HEPARIN LEVEL (UNFRACTIONATED)
HEPARIN UNFRACTIONATED: 0.2 [IU]/mL — AB (ref 0.30–0.70)
Heparin Unfractionated: 0.22 IU/mL — ABNORMAL LOW (ref 0.30–0.70)
Heparin Unfractionated: 0.33 IU/mL (ref 0.30–0.70)

## 2018-11-13 LAB — APTT: APTT: 174 s — AB (ref 24–36)

## 2018-11-13 NOTE — Progress Notes (Addendum)
Pulmonary Critical Care Medicine [facility] [place]   PULMONARY CRITICAL CARE SERVICE  PROGRESS NOTE  Date of Service: 11/13/2018  [name]  [id number]:[id number]  DOB: 1946/05/29   DOA: 11/07/2018  Referring Physician: Carron Curie, MD  HPI: Brian Chung is a 73 y.o. male seen for follow up of Acute on Chronic Respiratory Failure.  Patient continues to do well with a goal of 16 hours on trach collar today.  He has minimal secretions and is doing well at 28% FiO2.  Medications: Reviewed on Rounds  Physical Exam:  Vitals: Pulse 85 respirations 21 blood pressure 124/59 O2 sat 100% temp 96.2  Ventilator Settings patient's not currently on ventilator  . General: Comfortable at this time . Eyes: Grossly normal lids, irises & conjunctiva . ENT: grossly tongue is normal . Neck: no obvious mass . Cardiovascular: S1 S2 normal no gallop . Respiratory: No wheezes or rhonchi noted . Abdomen: soft . Skin: no rash seen on limited exam . Musculoskeletal: not rigid . Psychiatric:unable to assess . Neurologic: no seizure no involuntary movements         Lab Data:   Basic Metabolic Panel: Recent Labs  Lab 11/08/18 0622 11/09/18 0448 11/10/18 0657 11/11/18 0532  NA 135 134* 135 133*  K 4.2 4.2 4.4 4.1  CL 103 102 101 101  CO2 22 22 27 23   GLUCOSE 128* 155* 128* 158*  BUN 11 13 17 19   CREATININE 0.83 0.74 0.83 0.70  CALCIUM 8.4* 8.1* 8.0* 8.1*  MG 2.1 2.0 2.1 2.1  PHOS 4.7* 3.6 3.2 3.1    ABG: Recent Labs  Lab 11/07/18 1541  PHART 7.501*  PCO2ART 31.8*  PO2ART 73.0*  HCO3 24.7  O2SAT 96.3    Liver Function Tests: Recent Labs  Lab 11/08/18 0622  AST 140*  ALT 268*  ALKPHOS 79  BILITOT 0.6  PROT 5.8*  ALBUMIN 2.4*   No results for input(s): LIPASE, AMYLASE in the last 168 hours. No results for input(s): AMMONIA in the last 168 hours.  CBC: Recent Labs  Lab 11/07/18 1459 11/08/18 0622 11/09/18 0448 11/10/18 0657 11/11/18 0532  WBC 8.3  9.7 14.6* 13.5* 19.0*  NEUTROABS 6.2 7.5  --   --   --   HGB 10.2* 10.0* 9.6* 8.4* 8.6*  HCT 31.3* 31.2* 28.9* 25.5* 26.3*  MCV 100.0 100.6* 101.8* 102.8* 99.6  PLT 151 156 170 162 174    Cardiac Enzymes: No results for input(s): CKTOTAL, CKMB, CKMBINDEX, TROPONINI in the last 168 hours.  BNP (last 3 results) No results for input(s): BNP in the last 8760 hours.  ProBNP (last 3 results) No results for input(s): PROBNP in the last 8760 hours.  Radiological Exams: No results found.  Assessment/Plan Active Problems:   Acute on chronic respiratory failure with hypoxia (HCC)   Lobar pneumonia (HCC)   Left acute arterial ischemic stroke, MCA (middle cerebral artery) (HCC)   Dysphagia   Acute metabolic encephalopathy   Acute kidney injury (AKI) with acute tubular necrosis (ATN) (HCC)   1. Acute on chronic respiratory failure with hypoxia continue weaning per protocol.  Continue secretion management and pulmonary toilet. 2. Lobar pneumonia treated continue to monitor 3. Dysphagia at baseline 4. Metabolic encephalopathy unchanged 5. Acute tubular necrosis follow-up on labs 6. Acute stroke rehab physical therapy as tolerated   I have personally seen and evaluated the patient, evaluated laboratory and imaging results, formulated the assessment and plan and placed orders. The Patient requires high complexity decision  making for assessment and support.  Case was discussed on Rounds with the Respiratory Therapy Staff  Allyne Gee, MD [facility] Pulmonary Critical Care Medicine Sleep Medicine

## 2018-11-14 DIAGNOSIS — R1310 Dysphagia, unspecified: Secondary | ICD-10-CM | POA: Diagnosis not present

## 2018-11-14 DIAGNOSIS — J9621 Acute and chronic respiratory failure with hypoxia: Secondary | ICD-10-CM | POA: Diagnosis not present

## 2018-11-14 DIAGNOSIS — G9341 Metabolic encephalopathy: Secondary | ICD-10-CM | POA: Diagnosis not present

## 2018-11-14 DIAGNOSIS — N170 Acute kidney failure with tubular necrosis: Secondary | ICD-10-CM | POA: Diagnosis not present

## 2018-11-14 LAB — PROTIME-INR
INR: 2.42
Prothrombin Time: 26 seconds — ABNORMAL HIGH (ref 11.4–15.2)

## 2018-11-14 LAB — CBC
HCT: 21.9 % — ABNORMAL LOW (ref 39.0–52.0)
Hemoglobin: 7 g/dL — ABNORMAL LOW (ref 13.0–17.0)
MCH: 32.4 pg (ref 26.0–34.0)
MCHC: 32 g/dL (ref 30.0–36.0)
MCV: 101.4 fL — ABNORMAL HIGH (ref 80.0–100.0)
Platelets: 238 10*3/uL (ref 150–400)
RBC: 2.16 MIL/uL — ABNORMAL LOW (ref 4.22–5.81)
RDW: 13.3 % (ref 11.5–15.5)
WBC: 10.7 10*3/uL — ABNORMAL HIGH (ref 4.0–10.5)
nRBC: 0.2 % (ref 0.0–0.2)

## 2018-11-14 LAB — BASIC METABOLIC PANEL
Anion gap: 7 (ref 5–15)
BUN: 21 mg/dL (ref 8–23)
CO2: 25 mmol/L (ref 22–32)
CREATININE: 0.78 mg/dL (ref 0.61–1.24)
Calcium: 7.9 mg/dL — ABNORMAL LOW (ref 8.9–10.3)
Chloride: 103 mmol/L (ref 98–111)
GFR calc Af Amer: >60 mL/min (ref >60)
GFR calc non Af Amer: >60 mL/min (ref >60)
Glucose, Bld: 121 mg/dL — ABNORMAL HIGH (ref 70–99)
Potassium: 4.3 mmol/L (ref 3.5–5.1)
Sodium: 135 mmol/L (ref 135–145)

## 2018-11-14 LAB — SUSCEPTIBILITY, AER + ANAEROB

## 2018-11-14 LAB — MAGNESIUM: Magnesium: 2.2 mg/dL (ref 1.7–2.4)

## 2018-11-14 LAB — ABO/RH: ABO/RH(D): O POS

## 2018-11-14 LAB — SUSCEPTIBILITY RESULT

## 2018-11-14 LAB — PREPARE RBC (CROSSMATCH)

## 2018-11-14 NOTE — Progress Notes (Signed)
Pulmonary Critical Care Medicine [facility] [place]   PULMONARY CRITICAL CARE SERVICE  PROGRESS NOTE  Date of Service: 11/14/2018  Brian BAMONTE  QAS:[id number]  DOB: [birth year]   DOA: 11/07/2018  Referring Physician: Carron Curie, MD  HPI: Brian Chung is a 73 y.o. male seen for follow up of Acute on Chronic Respiratory Failure.  Patient is on T collar has been doing fairly well.  The goal is for 20 hours  Medications: Reviewed on Rounds  Physical Exam:  Vitals: Temperature 99.9 pulse 88 respiratory rate 20 blood pressure 104/55 saturations 99%  Ventilator Settings currently off the ventilator on T collar  . General: Comfortable at this time . Eyes: Grossly normal lids, irises & conjunctiva . ENT: grossly tongue is normal . Neck: no obvious mass . Cardiovascular: S1 S2 normal no gallop . Respiratory: Coarse rhonchi expansion is equal . Abdomen: soft . Skin: no rash seen on limited exam . Musculoskeletal: not rigid . Psychiatric:unable to assess . Neurologic: no seizure no involuntary movements         Lab Data:   Basic Metabolic Panel: Recent Labs  Lab 11/08/18 0622 11/09/18 0448 11/10/18 0657 11/11/18 0532 11/14/18 0451  NA 135 134* 135 133* 135  K 4.2 4.2 4.4 4.1 4.3  CL 103 102 101 101 103  CO2 22 22 27 23 25   GLUCOSE 128* 155* 128* 158* 121*  BUN 11 13 17 19 21   CREATININE 0.83 0.74 0.83 0.70 0.78  CALCIUM 8.4* 8.1* 8.0* 8.1* 7.9*  MG 2.1 2.0 2.1 2.1 2.2  PHOS 4.7* 3.6 3.2 3.1  --     ABG: Recent Labs  Lab 11/07/18 1541  PHART 7.501*  PCO2ART 31.8*  PO2ART 73.0*  HCO3 24.7  O2SAT 96.3    Liver Function Tests: Recent Labs  Lab 11/08/18 0622  AST 140*  ALT 268*  ALKPHOS 79  BILITOT 0.6  PROT 5.8*  ALBUMIN 2.4*   No results for input(s): LIPASE, AMYLASE in the last 168 hours. No results for input(s): AMMONIA in the last 168 hours.  CBC: Recent Labs  Lab 11/07/18 1459 11/08/18 0622 11/09/18 0448 11/10/18 0657  11/11/18 0532 11/14/18 0451  WBC 8.3 9.7 14.6* 13.5* 19.0* 10.7*  NEUTROABS 6.2 7.5  --   --   --   --   HGB 10.2* 10.0* 9.6* 8.4* 8.6* 7.0*  HCT 31.3* 31.2* 28.9* 25.5* 26.3* 21.9*  MCV 100.0 100.6* 101.8* 102.8* 99.6 101.4*  PLT 151 156 170 162 174 238    Cardiac Enzymes: No results for input(s): CKTOTAL, CKMB, CKMBINDEX, TROPONINI in the last 168 hours.  BNP (last 3 results) No results for input(s): BNP in the last 8760 hours.  ProBNP (last 3 results) No results for input(s): PROBNP in the last 8760 hours.  Radiological Exams: No results found.  Assessment/Plan Active Problems:   Acute on chronic respiratory failure with hypoxia (HCC)   Lobar pneumonia (HCC)   Left acute arterial ischemic stroke, MCA (middle cerebral artery) (HCC)   Dysphagia   Acute metabolic encephalopathy   Acute kidney injury (AKI) with acute tubular necrosis (ATN) (HCC)   1. Acute on chronic respiratory failure with hypoxia continue weaning on T collar titrate oxygen continue pulmonary toilet 2. Lobar pneumonia treated we will continue to monitor 3. Acute middle cerebral artery stroke stable we will continue with supportive care therapy as tolerated 4. Metabolic encephalopathy unchanged 5. Acute kidney injury with tubular necrosis following labs which have normalized   I  have personally seen and evaluated the patient, evaluated laboratory and imaging results, formulated the assessment and plan and placed orders. The Patient requires high complexity decision making for assessment and support.  Case was discussed on Rounds with the Respiratory Therapy Staff  Allyne Gee, MD [facility] Pulmonary Critical Care Medicine Sleep Medicine

## 2018-11-15 DIAGNOSIS — G9341 Metabolic encephalopathy: Secondary | ICD-10-CM | POA: Diagnosis not present

## 2018-11-15 DIAGNOSIS — N170 Acute kidney failure with tubular necrosis: Secondary | ICD-10-CM | POA: Diagnosis not present

## 2018-11-15 DIAGNOSIS — J9621 Acute and chronic respiratory failure with hypoxia: Secondary | ICD-10-CM | POA: Diagnosis not present

## 2018-11-15 DIAGNOSIS — R1310 Dysphagia, unspecified: Secondary | ICD-10-CM | POA: Diagnosis not present

## 2018-11-15 LAB — CBC
HCT: 28 % — ABNORMAL LOW (ref 39.0–52.0)
Hemoglobin: 9.2 g/dL — ABNORMAL LOW (ref 13.0–17.0)
MCH: 32.4 pg (ref 26.0–34.0)
MCHC: 32.9 g/dL (ref 30.0–36.0)
MCV: 98.6 fL (ref 80.0–100.0)
Platelets: 268 10*3/uL (ref 150–400)
RBC: 2.84 MIL/uL — AB (ref 4.22–5.81)
RDW: 14.6 % (ref 11.5–15.5)
WBC: 11 10*3/uL — ABNORMAL HIGH (ref 4.0–10.5)
nRBC: 0.3 % — ABNORMAL HIGH (ref 0.0–0.2)

## 2018-11-15 LAB — PROTIME-INR
INR: 2.09
Prothrombin Time: 23.2 seconds — ABNORMAL HIGH (ref 11.4–15.2)

## 2018-11-15 LAB — CULTURE, RESPIRATORY W GRAM STAIN

## 2018-11-15 LAB — OCCULT BLOOD X 1 CARD TO LAB, STOOL: Fecal Occult Bld: NEGATIVE

## 2018-11-15 NOTE — Progress Notes (Signed)
Pulmonary Critical Care Medicine [facility] [place]   PULMONARY CRITICAL CARE SERVICE  PROGRESS NOTE  Date of Service: 11/15/2018  Brian Chung  [id number]:[id number]  DOB: Mar 24, 1946   DOA: 11/07/2018  Referring Physician: Carron Curie, MD  HPI: Brian Chung is a 72 y.o. male seen for follow up of Acute on Chronic Respiratory Failure.  Patient is currently on T collar the goal is for 24 hours patient is doing well so far.  No distress  Medications: Reviewed on Rounds  Physical Exam:  Vitals: Temperature 98.9 pulse 76 respiratory 18 blood pressure 98/51 saturations 99%  Ventilator Settings mode of ventilation is T collar FiO2 28%  . General: Comfortable at this time . Eyes: Grossly normal lids, irises & conjunctiva . ENT: grossly tongue is normal . Neck: no obvious mass . Cardiovascular: S1 S2 normal no gallop . Respiratory: Coarse breath sounds no rhonchi or rales . Abdomen: soft . Skin: no rash seen on limited exam . Musculoskeletal: not rigid . Psychiatric:unable to assess . Neurologic: no seizure no involuntary movements         Lab Data:   Basic Metabolic Panel: Recent Labs  Lab 11/09/18 0448 11/10/18 0657 11/11/18 0532 11/14/18 0451  NA 134* 135 133* 135  K 4.2 4.4 4.1 4.3  CL 102 101 101 103  CO2 22 27 23 25   GLUCOSE 155* 128* 158* 121*  BUN 13 17 19 21   CREATININE 0.74 0.83 0.70 0.78  CALCIUM 8.1* 8.0* 8.1* 7.9*  MG 2.0 2.1 2.1 2.2  PHOS 3.6 3.2 3.1  --     ABG: No results for input(s): PHART, PCO2ART, PO2ART, HCO3, O2SAT in the last 168 hours.  Liver Function Tests: No results for input(s): AST, ALT, ALKPHOS, BILITOT, PROT, ALBUMIN in the last 168 hours. No results for input(s): LIPASE, AMYLASE in the last 168 hours. No results for input(s): AMMONIA in the last 168 hours.  CBC: Recent Labs  Lab 11/09/18 0448 11/10/18 0657 11/11/18 0532 11/14/18 0451 11/15/18 0654  WBC 14.6* 13.5* 19.0* 10.7* 11.0*  HGB 9.6* 8.4* 8.6* 7.0*  9.2*  HCT 28.9* 25.5* 26.3* 21.9* 28.0*  MCV 101.8* 102.8* 99.6 101.4* 98.6  PLT 170 162 174 238 268    Cardiac Enzymes: No results for input(s): CKTOTAL, CKMB, CKMBINDEX, TROPONINI in the last 168 hours.  BNP (last 3 results) No results for input(s): BNP in the last 8760 hours.  ProBNP (last 3 results) No results for input(s): PROBNP in the last 8760 hours.  Radiological Exams: No results found.  Assessment/Plan Active Problems:   Acute on chronic respiratory failure with hypoxia (HCC)   Lobar pneumonia (HCC)   Left acute arterial ischemic stroke, MCA (middle cerebral artery) (HCC)   Dysphagia   Acute metabolic encephalopathy   Acute kidney injury (AKI) with acute tubular necrosis (ATN) (HCC)   1. Acute on chronic respiratory failure with hypoxia we will continue with T collar trials titrate oxygen as tolerated continue pulmonary toilet.  Goal as above is not for 24 hours 2. Lobar pneumonia treated clinically improved 3. Left ischemic stroke middle cerebral artery therapy as tolerated 4. Dysphagia followed by speech 5. Metabolic encephalopathy unchanged 6. Acute kidney injury labs are improved and resolved   I have personally seen and evaluated the patient, evaluated laboratory and imaging results, formulated the assessment and plan and placed orders. The Patient requires high complexity decision making for assessment and support.  Case was discussed on Rounds with the Respiratory Therapy Staff  Allyne Gee, MD [facility] Pulmonary Critical Care Medicine Sleep Medicine

## 2018-11-16 DIAGNOSIS — G9341 Metabolic encephalopathy: Secondary | ICD-10-CM | POA: Diagnosis not present

## 2018-11-16 DIAGNOSIS — R1310 Dysphagia, unspecified: Secondary | ICD-10-CM | POA: Diagnosis not present

## 2018-11-16 DIAGNOSIS — N170 Acute kidney failure with tubular necrosis: Secondary | ICD-10-CM | POA: Diagnosis not present

## 2018-11-16 DIAGNOSIS — J9621 Acute and chronic respiratory failure with hypoxia: Secondary | ICD-10-CM | POA: Diagnosis not present

## 2018-11-16 LAB — TYPE AND SCREEN
ABO/RH(D): O POS
Antibody Screen: NEGATIVE
Unit division: 0

## 2018-11-16 LAB — PROTIME-INR
INR: 2.01
INR: 1.91
PROTHROMBIN TIME: 21.6 s — AB (ref 11.4–15.2)
Prothrombin Time: 22.5 seconds — ABNORMAL HIGH (ref 11.4–15.2)

## 2018-11-16 LAB — BPAM RBC
Blood Product Expiration Date: 202001082359
ISSUE DATE / TIME: 202001030150
Unit Type and Rh: 5100

## 2018-11-16 NOTE — Progress Notes (Signed)
Pulmonary Critical Care Medicine [facility] [place]   PULMONARY CRITICAL CARE SERVICE  PROGRESS NOTE  Date of Service: 11/16/2018  Brian Chung  [id number]  DOB: 1946/04/24   DOA: 11/07/2018  Referring Physician: Carron Curie, MD  HPI: Brian Chung is a 73 y.o. male seen for follow up of Acute on Chronic Respiratory Failure.  Patient is comfortable right now without distress has been on T collar weaning  Medications: Reviewed on Rounds  Physical Exam:  Vitals: Temperature 97.8 pulse 118 respiratory 26 blood pressure 123/66 saturations 97%  Ventilator Settings patient is off the ventilator on T collar  . General: Comfortable at this time . Eyes: Grossly normal lids, irises & conjunctiva . ENT: grossly tongue is normal . Neck: no obvious mass . Cardiovascular: S1 S2 normal no gallop . Respiratory: Coarse breath sounds no rhonchi noted . Abdomen: soft . Skin: no rash seen on limited exam . Musculoskeletal: not rigid . Psychiatric:unable to assess . Neurologic: no seizure no involuntary movements         Lab Data:   Basic Metabolic Panel: Recent Labs  Lab 11/10/18 0657 11/11/18 0532 11/14/18 0451  NA 135 133* 135  K 4.4 4.1 4.3  CL 101 101 103  CO2 27 23 25   GLUCOSE 128* 158* 121*  BUN 17 19 21   CREATININE 0.83 0.70 0.78  CALCIUM 8.0* 8.1* 7.9*  MG 2.1 2.1 2.2  PHOS 3.2 3.1  --     ABG: No results for input(s): PHART, PCO2ART, PO2ART, HCO3, O2SAT in the last 168 hours.  Liver Function Tests: No results for input(s): AST, ALT, ALKPHOS, BILITOT, PROT, ALBUMIN in the last 168 hours. No results for input(s): LIPASE, AMYLASE in the last 168 hours. No results for input(s): AMMONIA in the last 168 hours.  CBC: Recent Labs  Lab 11/10/18 0657 11/11/18 0532 11/14/18 0451 11/15/18 0654  WBC 13.5* 19.0* 10.7* 11.0*  HGB 8.4* 8.6* 7.0* 9.2*  HCT 25.5* 26.3* 21.9* 28.0*  MCV 102.8* 99.6 101.4* 98.6  PLT 162 174 238 268    Cardiac  Enzymes: No results for input(s): CKTOTAL, CKMB, CKMBINDEX, TROPONINI in the last 168 hours.  BNP (last 3 results) No results for input(s): BNP in the last 8760 hours.  ProBNP (last 3 results) No results for input(s): PROBNP in the last 8760 hours.  Radiological Exams: No results found.  Assessment/Plan Active Problems:   Acute on chronic respiratory failure with hypoxia (HCC)   Lobar pneumonia (HCC)   Left acute arterial ischemic stroke, MCA (middle cerebral artery) (HCC)   Dysphagia   Acute metabolic encephalopathy   Acute kidney injury (AKI) with acute tubular necrosis (ATN) (HCC)   1. Acute on chronic respiratory failure with hypoxia patient will be continued on wean as per protocol.  Continue with supportive care 2. Lobar pneumonia treated we will continue to follow 3. Left acute stroke we will continue present management 4. Acute metabolic encephalopathy grossly unchanged 5. Acute tubular necrosis labs will be monitored this is resolved   I have personally seen and evaluated the patient, evaluated laboratory and imaging results, formulated the assessment and plan and placed orders. The Patient requires high complexity decision making for assessment and support.  Case was discussed on Rounds with the Respiratory Therapy Staff  Yevonne Pax, MD [facility] Pulmonary Critical Care Medicine Sleep Medicine

## 2018-11-17 DIAGNOSIS — J9621 Acute and chronic respiratory failure with hypoxia: Secondary | ICD-10-CM | POA: Diagnosis not present

## 2018-11-17 DIAGNOSIS — N170 Acute kidney failure with tubular necrosis: Secondary | ICD-10-CM | POA: Diagnosis not present

## 2018-11-17 DIAGNOSIS — R1310 Dysphagia, unspecified: Secondary | ICD-10-CM | POA: Diagnosis not present

## 2018-11-17 DIAGNOSIS — G9341 Metabolic encephalopathy: Secondary | ICD-10-CM | POA: Diagnosis not present

## 2018-11-17 LAB — PROTIME-INR
INR: 2.01
PROTHROMBIN TIME: 22.5 s — AB (ref 11.4–15.2)

## 2018-11-17 NOTE — Progress Notes (Signed)
Pulmonary Critical Care Medicine [facility] [place]   PULMONARY CRITICAL CARE SERVICE  PROGRESS NOTE  Date of Service: 11/17/2018  [name]  ZOX:[id number]  DOB: Apr 01, 1946   DOA: 11/07/2018  Referring Physician: Carron Curie, MD  HPI: Brian Chung is a 73 y.o. male seen for follow up of Acute on Chronic Respiratory Failure.  Patient at this time is on T collar has completed 48 hours now is going for 72 hours  Medications: Reviewed on Rounds  Physical Exam:  Vitals: Temperature 97.8 pulse 71 respiratory 18 blood pressure 115/67 saturations 96%  Ventilator Settings mode ventilation ST collar FiO2 28%  . General: Comfortable at this time . Eyes: Grossly normal lids, irises & conjunctiva . ENT: grossly tongue is normal . Neck: no obvious mass . Cardiovascular: S1 S2 normal no gallop . Respiratory: Scattered rhonchi expansion is equal . Abdomen: soft . Skin: no rash seen on limited exam . Musculoskeletal: not rigid . Psychiatric:unable to assess . Neurologic: no seizure no involuntary movements         Lab Data:   Basic Metabolic Panel: Recent Labs  Lab 11/11/18 0532 11/14/18 0451  NA 133* 135  K 4.1 4.3  CL 101 103  CO2 23 25  GLUCOSE 158* 121*  BUN 19 21  CREATININE 0.70 0.78  CALCIUM 8.1* 7.9*  MG 2.1 2.2  PHOS 3.1  --     ABG: No results for input(s): PHART, PCO2ART, PO2ART, HCO3, O2SAT in the last 168 hours.  Liver Function Tests: No results for input(s): AST, ALT, ALKPHOS, BILITOT, PROT, ALBUMIN in the last 168 hours. No results for input(s): LIPASE, AMYLASE in the last 168 hours. No results for input(s): AMMONIA in the last 168 hours.  CBC: Recent Labs  Lab 11/11/18 0532 11/14/18 0451 11/15/18 0654  WBC 19.0* 10.7* 11.0*  HGB 8.6* 7.0* 9.2*  HCT 26.3* 21.9* 28.0*  MCV 99.6 101.4* 98.6  PLT 174 238 268    Cardiac Enzymes: No results for input(s): CKTOTAL, CKMB, CKMBINDEX, TROPONINI in the last 168 hours.  BNP (last  3 results) No results for input(s): BNP in the last 8760 hours.  ProBNP (last 3 results) No results for input(s): PROBNP in the last 8760 hours.  Radiological Exams: No results found.  Assessment/Plan Active Problems:   Acute on chronic respiratory failure with hypoxia (HCC)   Lobar pneumonia (HCC)   Left acute arterial ischemic stroke, MCA (middle cerebral artery) (HCC)   Dysphagia   Acute metabolic encephalopathy   Acute kidney injury (AKI) with acute tubular necrosis (ATN) (HCC)   1. Acute on chronic respiratory failure with hypoxia we will continue to wean on T collar patient is tolerating it well. 2. Lobar pneumonia treated continue with aggressive pulmonary toilet supportive care 3. Left acute stroke middle cerebral artery patient is going to need ongoing rehab we will continue with supportive care. 4. Dysphagia followed by speech 5. Metabolic encephalopathy unchanged 6. Acute renal failure with ATN labs are improving we will continue to monitor   I have personally seen and evaluated the patient, evaluated laboratory and imaging results, formulated the assessment and plan and placed orders. The Patient requires high complexity decision making for assessment and support.  Case was discussed on Rounds with the Respiratory Therapy Staff  Brian Pax, MD [facility] Pulmonary Critical Care Medicine Sleep Medicine

## 2018-11-18 DIAGNOSIS — N170 Acute kidney failure with tubular necrosis: Secondary | ICD-10-CM | POA: Diagnosis not present

## 2018-11-18 DIAGNOSIS — G9341 Metabolic encephalopathy: Secondary | ICD-10-CM | POA: Diagnosis not present

## 2018-11-18 DIAGNOSIS — R1310 Dysphagia, unspecified: Secondary | ICD-10-CM | POA: Diagnosis not present

## 2018-11-18 DIAGNOSIS — J9621 Acute and chronic respiratory failure with hypoxia: Secondary | ICD-10-CM | POA: Diagnosis not present

## 2018-11-18 LAB — CBC
HCT: 32.5 % — ABNORMAL LOW (ref 39.0–52.0)
Hemoglobin: 10.4 g/dL — ABNORMAL LOW (ref 13.0–17.0)
MCH: 31.6 pg (ref 26.0–34.0)
MCHC: 32 g/dL (ref 30.0–36.0)
MCV: 98.8 fL (ref 80.0–100.0)
Platelets: 292 10*3/uL (ref 150–400)
RBC: 3.29 MIL/uL — ABNORMAL LOW (ref 4.22–5.81)
RDW: 14.1 % (ref 11.5–15.5)
WBC: 10.3 10*3/uL (ref 4.0–10.5)
nRBC: 0 % (ref 0.0–0.2)

## 2018-11-18 LAB — BASIC METABOLIC PANEL
Anion gap: 6 (ref 5–15)
BUN: 19 mg/dL (ref 8–23)
CO2: 25 mmol/L (ref 22–32)
Calcium: 8.6 mg/dL — ABNORMAL LOW (ref 8.9–10.3)
Chloride: 102 mmol/L (ref 98–111)
Creatinine, Ser: 0.78 mg/dL (ref 0.61–1.24)
GFR calc non Af Amer: >60 mL/min (ref >60)
Glucose, Bld: 116 mg/dL — ABNORMAL HIGH (ref 70–99)
Potassium: 4.5 mmol/L (ref 3.5–5.1)
Sodium: 133 mmol/L — ABNORMAL LOW (ref 135–145)

## 2018-11-18 LAB — PROTIME-INR
INR: 2.14
Prothrombin Time: 23.6 seconds — ABNORMAL HIGH (ref 11.4–15.2)

## 2018-11-18 NOTE — Progress Notes (Signed)
Pulmonary Critical Care Medicine [facility] [place]   PULMONARY CRITICAL CARE SERVICE  PROGRESS NOTE  Date of Service: 11/18/2018  Brian Chung  [id number]  DOB: 04/02/1946   DOA: 11/07/2018  Referring Physician: Carron Curie, MD  HPI: Brian Chung is a 73 y.o. male seen for follow up of Acute on Chronic Respiratory Failure.  Patient right now is on T collar has been on 28% FiO2 did not tolerate cuff deflation had increased cough noted  Medications: Reviewed on Rounds  Physical Exam:  Vitals: Temperature 97.3 pulse 74 respiratory 20 blood pressure 125/66 saturations 97%  Ventilator Settings currently is on T collar FiO2 28%  . General: Comfortable at this time . Eyes: Grossly normal lids, irises & conjunctiva . ENT: grossly tongue is normal . Neck: no obvious mass . Cardiovascular: S1 S2 normal no gallop . Respiratory: No rhonchi or rales are noted at this time . Abdomen: soft . Skin: no rash seen on limited exam . Musculoskeletal: not rigid . Psychiatric:unable to assess . Neurologic: no seizure no involuntary movements         Lab Data:   Basic Metabolic Panel: Recent Labs  Lab 11/14/18 0451 11/18/18 0753  NA 135 133*  K 4.3 4.5  CL 103 102  CO2 25 25  GLUCOSE 121* 116*  BUN 21 19  CREATININE 0.78 0.78  CALCIUM 7.9* 8.6*  MG 2.2  --     ABG: No results for input(s): PHART, PCO2ART, PO2ART, HCO3, O2SAT in the last 168 hours.  Liver Function Tests: No results for input(s): AST, ALT, ALKPHOS, BILITOT, PROT, ALBUMIN in the last 168 hours. No results for input(s): LIPASE, AMYLASE in the last 168 hours. No results for input(s): AMMONIA in the last 168 hours.  CBC: Recent Labs  Lab 11/14/18 0451 11/15/18 0654 11/18/18 0753  WBC 10.7* 11.0* 10.3  HGB 7.0* 9.2* 10.4*  HCT 21.9* 28.0* 32.5*  MCV 101.4* 98.6 98.8  PLT 238 268 292    Cardiac Enzymes: No results for input(s): CKTOTAL, CKMB, CKMBINDEX, TROPONINI in the last 168  hours.  BNP (last 3 results) No results for input(s): BNP in the last 8760 hours.  ProBNP (last 3 results) No results for input(s): PROBNP in the last 8760 hours.  Radiological Exams: No results found.  Assessment/Plan Active Problems:   Acute on chronic respiratory failure with hypoxia (HCC)   Lobar pneumonia (HCC)   Left acute arterial ischemic stroke, MCA (middle cerebral artery) (HCC)   Dysphagia   Acute metabolic encephalopathy   Acute kidney injury (AKI) with acute tubular necrosis (ATN) (HCC)   1. Acute on chronic respiratory failure with hypoxia we will continue with T collar continue secretion management pulmonary toilet. 2. Lobar pneumonia treated at this time we will continue present management 3. Left ischemic stroke middle cerebral artery physical therapy as tolerated 4. Dysphagia speech to follow 5. Acute metabolic encephalopathy grossly unchanged 6. Acute renal failure labs have improved we will continue to monitor   I have personally seen and evaluated the patient, evaluated laboratory and imaging results, formulated the assessment and plan and placed orders. The Patient requires high complexity decision making for assessment and support.  Case was discussed on Rounds with the Respiratory Therapy Staff  Yevonne Pax, MD [facility] Pulmonary Critical Care Medicine Sleep Medicine

## 2018-11-19 DIAGNOSIS — N170 Acute kidney failure with tubular necrosis: Secondary | ICD-10-CM | POA: Diagnosis not present

## 2018-11-19 DIAGNOSIS — J9621 Acute and chronic respiratory failure with hypoxia: Secondary | ICD-10-CM | POA: Diagnosis not present

## 2018-11-19 DIAGNOSIS — G9341 Metabolic encephalopathy: Secondary | ICD-10-CM | POA: Diagnosis not present

## 2018-11-19 DIAGNOSIS — R1310 Dysphagia, unspecified: Secondary | ICD-10-CM | POA: Diagnosis not present

## 2018-11-19 NOTE — Progress Notes (Signed)
[facility] [place]   PULMONARY CRITICAL CARE SERVICE  PROGRESS NOTE  Date of Service: 11/19/2018  Brian Chung  ZOX:[id number]  DOB: 05/09/1946   DOA: 11/07/2018  Referring Physician: Carron CurieAli Hijazi, MD  HPI: Brian Chung is a 73 y.o. male seen for follow up of Acute on Chronic Respiratory Failure.  Patient is on T collar at this time is been on 28% FiO2 off the ventilator for more than 24 hours tolerating cuff deflation now  Medications: Reviewed on Rounds  Physical Exam:  Vitals: Temperature 98.2 pulse 82 respiratory rate 13 blood pressure 96/60 saturations 99%  Ventilator Settings off the ventilator on T collar  . General: Comfortable at this time . Eyes: Grossly normal lids, irises & conjunctiva . ENT: grossly tongue is normal . Neck: no obvious mass . Cardiovascular: S1 S2 normal no gallop . Respiratory: No rhonchi or rales are noted at this time . Abdomen: soft . Skin: no rash seen on limited exam . Musculoskeletal: not rigid . Psychiatric:unable to assess . Neurologic: no seizure no involuntary movements         Lab Data:   Basic Metabolic Panel: Recent Labs  Lab 11/14/18 0451 11/18/18 0753  NA 135 133*  K 4.3 4.5  CL 103 102  CO2 25 25  GLUCOSE 121* 116*  BUN 21 19  CREATININE 0.78 0.78  CALCIUM 7.9* 8.6*  MG 2.2  --     ABG: No results for input(s): PHART, PCO2ART, PO2ART, HCO3, O2SAT in the last 168 hours.  Liver Function Tests: No results for input(s): AST, ALT, ALKPHOS, BILITOT, PROT, ALBUMIN in the last 168 hours. No results for input(s): LIPASE, AMYLASE in the last 168 hours. No results for input(s): AMMONIA in the last 168 hours.  CBC: Recent Labs  Lab 11/14/18 0451 11/15/18 0654 11/18/18 0753  WBC 10.7* 11.0* 10.3  HGB 7.0* 9.2* 10.4*  HCT 21.9* 28.0* 32.5*  MCV 101.4* 98.6 98.8  PLT 238 268 292    Cardiac Enzymes: No results for input(s): CKTOTAL, CKMB, CKMBINDEX, TROPONINI in  the last 168 hours.  BNP (last 3 results) No results for input(s): BNP in the last 8760 hours.  ProBNP (last 3 results) No results for input(s): PROBNP in the last 8760 hours.  Radiological Exams: No results found.  Assessment/Plan Active Problems:   Acute on chronic respiratory failure with hypoxia (HCC)   Lobar pneumonia (HCC)   Left acute arterial ischemic stroke, MCA (middle cerebral artery) (HCC)   Dysphagia   Acute metabolic encephalopathy   Acute kidney injury (AKI) with acute tubular necrosis (ATN) (HCC)   1. Acute on chronic respiratory failure with hypoxia we will continue with T collar weaning as tolerated continue pulmonary toilet secretion management. 2. Lobar pneumonia treated we will continue with supportive care 3. Left ischemic stroke at baseline 4. Metabolic encephalopathy unchanged 5. Acute kidney injury with ATN at baseline we will continue with supportive care   I have personally seen and evaluated the patient, evaluated laboratory and imaging results, formulated the assessment and plan and placed orders. The Patient requires high complexity decision making for assessment and support.  Case was discussed on Rounds with the Respiratory Therapy Staff  Yevonne PaxSaadat A Margarethe Virgen, MD [facility] Pulmonary Critical Care Medicine Sleep Medicine

## 2018-11-20 DIAGNOSIS — J9621 Acute and chronic respiratory failure with hypoxia: Secondary | ICD-10-CM | POA: Diagnosis not present

## 2018-11-20 DIAGNOSIS — R1310 Dysphagia, unspecified: Secondary | ICD-10-CM | POA: Diagnosis not present

## 2018-11-20 DIAGNOSIS — N170 Acute kidney failure with tubular necrosis: Secondary | ICD-10-CM | POA: Diagnosis not present

## 2018-11-20 DIAGNOSIS — G9341 Metabolic encephalopathy: Secondary | ICD-10-CM | POA: Diagnosis not present

## 2018-11-20 LAB — C DIFFICILE QUICK SCREEN W PCR REFLEX
C Diff antigen: NEGATIVE
C Diff interpretation: NOT DETECTED
C Diff toxin: NEGATIVE

## 2018-11-20 LAB — PROTIME-INR
INR: 2.4
Prothrombin Time: 25.8 seconds — ABNORMAL HIGH (ref 11.4–15.2)

## 2018-11-20 NOTE — Progress Notes (Signed)
Pulmonary Critical Care Medicine [facility] [place]   PULMONARY CRITICAL CARE SERVICE  PROGRESS NOTE  Date of Service: 11/20/2018  [name]  [id number]  DOB: 1946/09/10   DOA: 11/07/2018  Referring Physician: Carron Curie, MD  HPI: Brian Chung is a 73 y.o. male seen for follow up of Acute on Chronic Respiratory Failure.  Patient currently is on T collar doing well is currently on 28% FiO2 good saturations are noted  Medications: Reviewed on Rounds  Physical Exam:  Vitals: Temperature 97.8 pulse 69 respiratory rate 20 blood pressure 134/75 saturations 96%  Ventilator Settings currently is off the ventilator on T collar trials has been on 28% FiO2  . General: Comfortable at this time . Eyes: Grossly normal lids, irises & conjunctiva . ENT: grossly tongue is normal . Neck: no obvious mass . Cardiovascular: S1 S2 normal no gallop . Respiratory: No rhonchi or rales are noted at this time . Abdomen: soft . Skin: no rash seen on limited exam . Musculoskeletal: not rigid . Psychiatric:unable to assess . Neurologic: no seizure no involuntary movements         Lab Data:   Basic Metabolic Panel: Recent Labs  Lab 11/14/18 0451 11/18/18 0753  NA 135 133*  K 4.3 4.5  CL 103 102  CO2 25 25  GLUCOSE 121* 116*  BUN 21 19  CREATININE 0.78 0.78  CALCIUM 7.9* 8.6*  MG 2.2  --     ABG: No results for input(s): PHART, PCO2ART, PO2ART, HCO3, O2SAT in the last 168 hours.  Liver Function Tests: No results for input(s): AST, ALT, ALKPHOS, BILITOT, PROT, ALBUMIN in the last 168 hours. No results for input(s): LIPASE, AMYLASE in the last 168 hours. No results for input(s): AMMONIA in the last 168 hours.  CBC: Recent Labs  Lab 11/14/18 0451 11/15/18 0654 11/18/18 0753  WBC 10.7* 11.0* 10.3  HGB 7.0* 9.2* 10.4*  HCT 21.9* 28.0* 32.5*  MCV 101.4* 98.6 98.8  PLT 238 268 292    Cardiac Enzymes: No results for input(s): CKTOTAL, CKMB, CKMBINDEX,  TROPONINI in the last 168 hours.  BNP (last 3 results) No results for input(s): BNP in the last 8760 hours.  ProBNP (last 3 results) No results for input(s): PROBNP in the last 8760 hours.  Radiological Exams: No results found.  Assessment/Plan Active Problems:   Acute on chronic respiratory failure with hypoxia (HCC)   Lobar pneumonia (HCC)   Left acute arterial ischemic stroke, MCA (middle cerebral artery) (HCC)   Dysphagia   Acute metabolic encephalopathy   Acute kidney injury (AKI) with acute tubular necrosis (ATN) (HCC)   1. Acute on chronic respiratory failure with hypoxia we will continue to wean on T collar continue aggressive pulmonary toilet. 2. Lobar pneumonia treated clinically improving 3. Left acute stroke continue therapy as tolerated 4. Dysphagia baseline speech following 5. Metabolic encephalopathy unchanged 6. Acute renal failure resolved   I have personally seen and evaluated the patient, evaluated laboratory and imaging results, formulated the assessment and plan and placed orders. The Patient requires high complexity decision making for assessment and support.  Case was discussed on Rounds with the Respiratory Therapy Staff  Yevonne Pax, MD [facility] Pulmonary Critical Care Medicine Sleep Medicine

## 2018-11-21 ENCOUNTER — Other Ambulatory Visit (HOSPITAL_COMMUNITY): Payer: Medicare Other

## 2018-11-21 DIAGNOSIS — G9341 Metabolic encephalopathy: Secondary | ICD-10-CM | POA: Diagnosis not present

## 2018-11-21 DIAGNOSIS — J9621 Acute and chronic respiratory failure with hypoxia: Secondary | ICD-10-CM | POA: Diagnosis not present

## 2018-11-21 DIAGNOSIS — R1310 Dysphagia, unspecified: Secondary | ICD-10-CM | POA: Diagnosis not present

## 2018-11-21 DIAGNOSIS — N170 Acute kidney failure with tubular necrosis: Secondary | ICD-10-CM | POA: Diagnosis not present

## 2018-11-21 LAB — PROTIME-INR
INR: 2.08
Prothrombin Time: 23.1 seconds — ABNORMAL HIGH (ref 11.4–15.2)

## 2018-11-21 NOTE — Progress Notes (Signed)
Pulmonary Critical Care Medicine [facility] [place]   PULMONARY CRITICAL CARE SERVICE  PROGRESS NOTE  Date of Service: 11/21/2018  ALCUIN Chung  ZHY:[id number]  DOB: 03/05/1946   DOA: 11/07/2018  Referring Physician: Carron Curie, MD  HPI: Brian Chung is a 73 y.o. male seen for follow up of Acute on Chronic Respiratory Failure.  Patient at this time is on T collar doing fairly well should be able to proceed to downsize the tracheostomy  Medications: Reviewed on Rounds  Physical Exam:  Vitals: Temperature 97.4 pulse 72 respiratory 24 blood pressure 124/70 saturations 94%  Ventilator Settings currently is on T collar 28% FiO2  . General: Comfortable at this time . Eyes: Grossly normal lids, irises & conjunctiva . ENT: grossly tongue is normal . Neck: no obvious mass . Cardiovascular: S1 S2 normal no gallop . Respiratory: No rhonchi or rales are noted at this time . Abdomen: soft . Skin: no rash seen on limited exam . Musculoskeletal: not rigid . Psychiatric:unable to assess . Neurologic: no seizure no involuntary movements         Lab Data:   Basic Metabolic Panel: Recent Labs  Lab 11/18/18 0753  NA 133*  K 4.5  CL 102  CO2 25  GLUCOSE 116*  BUN 19  CREATININE 0.78  CALCIUM 8.6*    ABG: No results for input(s): PHART, PCO2ART, PO2ART, HCO3, O2SAT in the last 168 hours.  Liver Function Tests: No results for input(s): AST, ALT, ALKPHOS, BILITOT, PROT, ALBUMIN in the last 168 hours. No results for input(s): LIPASE, AMYLASE in the last 168 hours. No results for input(s): AMMONIA in the last 168 hours.  CBC: Recent Labs  Lab 11/15/18 0654 11/18/18 0753  WBC 11.0* 10.3  HGB 9.2* 10.4*  HCT 28.0* 32.5*  MCV 98.6 98.8  PLT 268 292    Cardiac Enzymes: No results for input(s): CKTOTAL, CKMB, CKMBINDEX, TROPONINI in the last 168 hours.  BNP (last 3 results) No results for input(s): BNP in the last 8760 hours.  ProBNP (last 3  results) No results for input(s): PROBNP in the last 8760 hours.  Radiological Exams: No results found.  Assessment/Plan Active Problems:   Acute on chronic respiratory failure with hypoxia (HCC)   Lobar pneumonia (HCC)   Left acute arterial ischemic stroke, MCA (middle cerebral artery) (HCC)   Dysphagia   Acute metabolic encephalopathy   Acute kidney injury (AKI) with acute tubular necrosis (ATN) (HCC)   1. Acute on chronic respiratory failure with hypoxia continue with T collar continue secretion management pulmonary toilet. 2. Lobar pneumonia treated continue with present management. 3. Left middle cerebral artery territory stroke therapy as tolerated 4. Dysphagia at baseline 5. Acute metabolic encephalopathy stable at this time 6. Acute renal failure improved we will continue to monitor labs   I have personally seen and evaluated the patient, evaluated laboratory and imaging results, formulated the assessment and plan and placed orders. The Patient requires high complexity decision making for assessment and support.  Case was discussed on Rounds with the Respiratory Therapy Staff  Yevonne Pax, MD [facility] Pulmonary Critical Care Medicine Sleep Medicine

## 2018-11-22 DIAGNOSIS — N170 Acute kidney failure with tubular necrosis: Secondary | ICD-10-CM | POA: Diagnosis not present

## 2018-11-22 DIAGNOSIS — G9341 Metabolic encephalopathy: Secondary | ICD-10-CM | POA: Diagnosis not present

## 2018-11-22 DIAGNOSIS — J9621 Acute and chronic respiratory failure with hypoxia: Secondary | ICD-10-CM | POA: Diagnosis not present

## 2018-11-22 DIAGNOSIS — R1310 Dysphagia, unspecified: Secondary | ICD-10-CM | POA: Diagnosis not present

## 2018-11-22 LAB — BASIC METABOLIC PANEL
ANION GAP: 6 (ref 5–15)
BUN: 22 mg/dL (ref 8–23)
CO2: 27 mmol/L (ref 22–32)
Calcium: 8.9 mg/dL (ref 8.9–10.3)
Chloride: 104 mmol/L (ref 98–111)
Creatinine, Ser: 0.93 mg/dL (ref 0.61–1.24)
GFR calc Af Amer: >60 mL/min (ref >60)
GFR calc non Af Amer: >60 mL/min (ref >60)
Glucose, Bld: 119 mg/dL — ABNORMAL HIGH (ref 70–99)
Potassium: 4.2 mmol/L (ref 3.5–5.1)
Sodium: 137 mmol/L (ref 135–145)

## 2018-11-22 LAB — CBC
HCT: 36.1 % — ABNORMAL LOW (ref 39.0–52.0)
Hemoglobin: 11.4 g/dL — ABNORMAL LOW (ref 13.0–17.0)
MCH: 31.5 pg (ref 26.0–34.0)
MCHC: 31.6 g/dL (ref 30.0–36.0)
MCV: 99.7 fL (ref 80.0–100.0)
NRBC: 0 % (ref 0.0–0.2)
Platelets: 280 10*3/uL (ref 150–400)
RBC: 3.62 MIL/uL — ABNORMAL LOW (ref 4.22–5.81)
RDW: 13.9 % (ref 11.5–15.5)
WBC: 11.1 10*3/uL — ABNORMAL HIGH (ref 4.0–10.5)

## 2018-11-22 LAB — MAGNESIUM: Magnesium: 2.2 mg/dL (ref 1.7–2.4)

## 2018-11-22 LAB — PROTIME-INR
INR: 1.75
Prothrombin Time: 20.2 seconds — ABNORMAL HIGH (ref 11.4–15.2)

## 2018-11-22 LAB — PHOSPHORUS: Phosphorus: 4 mg/dL (ref 2.5–4.6)

## 2018-11-22 NOTE — Progress Notes (Signed)
Pulmonary Critical Care Medicine [facility] [place]   PULMONARY CRITICAL CARE SERVICE  PROGRESS NOTE  Date of Service: 11/22/2018  Brian Chung  [id number]  DOB: Feb 13, 1946   DOA: 11/07/2018  Referring Physician: Carron Curie, MD  HPI: Brian Chung is a 73 y.o. male seen for follow up of Acute on Chronic Respiratory Failure.  Patient currently is on T collar comfortable without distress has been on 28% FiO2.  Chest x-ray looks like it is improving  Medications: Reviewed on Rounds  Physical Exam:  Vitals: Temperature 97.0 pulse 79 respiratory 24 blood pressure 115/65 saturations 95%  Ventilator Settings currently is on T collar FiO2 28%  . General: Comfortable at this time . Eyes: Grossly normal lids, irises & conjunctiva . ENT: grossly tongue is normal . Neck: no obvious mass . Cardiovascular: S1 S2 normal no gallop . Respiratory: No rhonchi or rales are noted at this time . Abdomen: soft . Skin: no rash seen on limited exam . Musculoskeletal: not rigid . Psychiatric:unable to assess . Neurologic: no seizure no involuntary movements         Lab Data:   Basic Metabolic Panel: Recent Labs  Lab 11/18/18 0753  NA 133*  K 4.5  CL 102  CO2 25  GLUCOSE 116*  BUN 19  CREATININE 0.78  CALCIUM 8.6*    ABG: No results for input(s): PHART, PCO2ART, PO2ART, HCO3, O2SAT in the last 168 hours.  Liver Function Tests: No results for input(s): AST, ALT, ALKPHOS, BILITOT, PROT, ALBUMIN in the last 168 hours. No results for input(s): LIPASE, AMYLASE in the last 168 hours. No results for input(s): AMMONIA in the last 168 hours.  CBC: Recent Labs  Lab 11/18/18 0753 11/22/18 0739  WBC 10.3 11.1*  HGB 10.4* 11.4*  HCT 32.5* 36.1*  MCV 98.8 99.7  PLT 292 280    Cardiac Enzymes: No results for input(s): CKTOTAL, CKMB, CKMBINDEX, TROPONINI in the last 168 hours.  BNP (last 3 results) No results for input(s): BNP in the last 8760 hours.  ProBNP  (last 3 results) No results for input(s): PROBNP in the last 8760 hours.  Radiological Exams: Dg Chest Port 1 View  Result Date: 11/21/2018 CLINICAL DATA:  Increased tracheal secretions, history of pneumonia and chronic respiratory failure EXAM: PORTABLE CHEST 1 VIEW COMPARISON:  Portable chest x-ray of 11/11/2018 FINDINGS: Tracheostomy remains in appears to be in good position overlying the tracheal air shadow. Only mild atelectasis is noted at the lung bases left slightly greater than right. No pneumonia or effusion is seen. The heart is unchanged in size. No acute bony abnormality is seen. The left PICC line has been removed. IMPRESSION: 1. No change in position of tracheostomy. 2. No change in bibasilar atelectasis left greater than right. 3. Left PICC line removed. Electronically Signed   By: Dwyane Dee M.D.   On: 11/21/2018 14:43    Assessment/Plan Active Problems:   Acute on chronic respiratory failure with hypoxia (HCC)   Lobar pneumonia (HCC)   Left acute arterial ischemic stroke, MCA (middle cerebral artery) (HCC)   Dysphagia   Acute metabolic encephalopathy   Acute kidney injury (AKI) with acute tubular necrosis (ATN) (HCC)   1. Acute on chronic respiratory failure with hypoxia we will continue with the T collar continue pulmonary toilet supportive care. 2. Lobar pneumonia treated we will continue to monitor 3. Left acute stroke unchanged we will continue with supportive care 4. Dysphagia followed by speech 5. Metabolic encephalopathy grossly unchanged  6. Acute kidney injury resolved   I have personally seen and evaluated the patient, evaluated laboratory and imaging results, formulated the assessment and plan and placed orders. The Patient requires high complexity decision making for assessment and support.  Case was discussed on Rounds with the Respiratory Therapy Staff  Yevonne Pax, MD [facility] Pulmonary Critical Care Medicine Sleep Medicine

## 2018-11-23 DIAGNOSIS — R1310 Dysphagia, unspecified: Secondary | ICD-10-CM | POA: Diagnosis not present

## 2018-11-23 DIAGNOSIS — N170 Acute kidney failure with tubular necrosis: Secondary | ICD-10-CM | POA: Diagnosis not present

## 2018-11-23 DIAGNOSIS — G9341 Metabolic encephalopathy: Secondary | ICD-10-CM | POA: Diagnosis not present

## 2018-11-23 DIAGNOSIS — J9621 Acute and chronic respiratory failure with hypoxia: Secondary | ICD-10-CM | POA: Diagnosis not present

## 2018-11-23 LAB — PROTIME-INR
INR: 1.41
Prothrombin Time: 17.1 seconds — ABNORMAL HIGH (ref 11.4–15.2)

## 2018-11-23 NOTE — Progress Notes (Signed)
Pulmonary Critical Care Medicine [facility] [place]   PULMONARY CRITICAL CARE SERVICE  PROGRESS NOTE  Date of Service: 11/23/2018  Brian Chung  [id number]  DOB: 12-May-1946   DOA: 11/07/2018  Referring Physician: Carron Curie, MD  HPI: Brian Chung is a 73 y.o. male seen for follow up of Acute on Chronic Respiratory Failure.  Patient currently is on T collar 28% FiO2 has been tolerating the PMV fairly well also  Medications: Reviewed on Rounds  Physical Exam:  Vitals: Temperature 98.0 pulse 68 respiratory rate 18 blood pressure 105/56 saturations 92%  Ventilator Settings off the ventilator on T collar at this time  . General: Comfortable at this time . Eyes: Grossly normal lids, irises & conjunctiva . ENT: grossly tongue is normal . Neck: no obvious mass . Cardiovascular: S1 S2 normal no gallop . Respiratory: No rhonchi or rales are noted . Abdomen: soft . Skin: no rash seen on limited exam . Musculoskeletal: not rigid . Psychiatric:unable to assess . Neurologic: no seizure no involuntary movements         Lab Data:   Basic Metabolic Panel: Recent Labs  Lab 11/18/18 0753 11/22/18 0739  NA 133* 137  K 4.5 4.2  CL 102 104  CO2 25 27  GLUCOSE 116* 119*  BUN 19 22  CREATININE 0.78 0.93  CALCIUM 8.6* 8.9  MG  --  2.2  PHOS  --  4.0    ABG: No results for input(s): PHART, PCO2ART, PO2ART, HCO3, O2SAT in the last 168 hours.  Liver Function Tests: No results for input(s): AST, ALT, ALKPHOS, BILITOT, PROT, ALBUMIN in the last 168 hours. No results for input(s): LIPASE, AMYLASE in the last 168 hours. No results for input(s): AMMONIA in the last 168 hours.  CBC: Recent Labs  Lab 11/18/18 0753 11/22/18 0739  WBC 10.3 11.1*  HGB 10.4* 11.4*  HCT 32.5* 36.1*  MCV 98.8 99.7  PLT 292 280    Cardiac Enzymes: No results for input(s): CKTOTAL, CKMB, CKMBINDEX, TROPONINI in the last 168 hours.  BNP (last 3 results) No results for  input(s): BNP in the last 8760 hours.  ProBNP (last 3 results) No results for input(s): PROBNP in the last 8760 hours.  Radiological Exams: Dg Chest Port 1 View  Result Date: 11/21/2018 CLINICAL DATA:  Increased tracheal secretions, history of pneumonia and chronic respiratory failure EXAM: PORTABLE CHEST 1 VIEW COMPARISON:  Portable chest x-ray of 11/11/2018 FINDINGS: Tracheostomy remains in appears to be in good position overlying the tracheal air shadow. Only mild atelectasis is noted at the lung bases left slightly greater than right. No pneumonia or effusion is seen. The heart is unchanged in size. No acute bony abnormality is seen. The left PICC line has been removed. IMPRESSION: 1. No change in position of tracheostomy. 2. No change in bibasilar atelectasis left greater than right. 3. Left PICC line removed. Electronically Signed   By: Dwyane Dee M.D.   On: 11/21/2018 14:43    Assessment/Plan Active Problems:   Acute on chronic respiratory failure with hypoxia (HCC)   Lobar pneumonia (HCC)   Left acute arterial ischemic stroke, MCA (middle cerebral artery) (HCC)   Dysphagia   Acute metabolic encephalopathy   Acute kidney injury (AKI) with acute tubular necrosis (ATN) (HCC)   1. Acute on chronic respiratory failure with hypoxia we will continue with T collar wean continue secretion management I am going to try to advance to capping at this point 2. Lobar pneumonia treated  clinically improved 3. Dysphagia at baseline 4. Ischemic stroke at baseline we will continue supportive care 5. Metabolic encephalopathy improved 6. Acute renal failure labs have improved   I have personally seen and evaluated the patient, evaluated laboratory and imaging results, formulated the assessment and plan and placed orders. The Patient requires high complexity decision making for assessment and support.  Case was discussed on Rounds with the Respiratory Therapy Staff  Yevonne Pax, MD  [facility] Pulmonary Critical Care Medicine Sleep Medicine

## 2018-11-24 DIAGNOSIS — J9621 Acute and chronic respiratory failure with hypoxia: Secondary | ICD-10-CM | POA: Diagnosis not present

## 2018-11-24 DIAGNOSIS — R1310 Dysphagia, unspecified: Secondary | ICD-10-CM | POA: Diagnosis not present

## 2018-11-24 DIAGNOSIS — G9341 Metabolic encephalopathy: Secondary | ICD-10-CM | POA: Diagnosis not present

## 2018-11-24 DIAGNOSIS — N170 Acute kidney failure with tubular necrosis: Secondary | ICD-10-CM | POA: Diagnosis not present

## 2018-11-24 LAB — PROTIME-INR
INR: 1.39
PROTHROMBIN TIME: 16.9 s — AB (ref 11.4–15.2)

## 2018-11-24 NOTE — Progress Notes (Signed)
Pulmonary Critical Care Medicine [facility] [place]   PULMONARY CRITICAL CARE SERVICE  PROGRESS NOTE  Date of Service: 11/24/2018  JAGJIT KLAHN  ZOX:[id number]  DOB: 07-Nov-1946   DOA: 11/07/2018  Referring Physician: Carron Curie, MD  HPI: Brian Chung is a 73 y.o. male seen for follow up of Acute on Chronic Respiratory Failure.  Patient is capping doing well.  At this time  Medications: Reviewed on Rounds  Physical Exam:  Vitals: Temperature 98.2 pulse 69 respiratory 19 blood pressure 117/66 saturations 99%  Ventilator Settings off the ventilator capping at this time  . General: Comfortable at this time . Eyes: Grossly normal lids, irises & conjunctiva . ENT: grossly tongue is normal . Neck: no obvious mass . Cardiovascular: S1 S2 normal no gallop . Respiratory: No rhonchi or rales are noted . Abdomen: soft . Skin: no rash seen on limited exam . Musculoskeletal: not rigid . Psychiatric:unable to assess . Neurologic: no seizure no involuntary movements         Lab Data:   Basic Metabolic Panel: Recent Labs  Lab 11/18/18 0753 11/22/18 0739  NA 133* 137  K 4.5 4.2  CL 102 104  CO2 25 27  GLUCOSE 116* 119*  BUN 19 22  CREATININE 0.78 0.93  CALCIUM 8.6* 8.9  MG  --  2.2  PHOS  --  4.0    ABG: No results for input(s): PHART, PCO2ART, PO2ART, HCO3, O2SAT in the last 168 hours.  Liver Function Tests: No results for input(s): AST, ALT, ALKPHOS, BILITOT, PROT, ALBUMIN in the last 168 hours. No results for input(s): LIPASE, AMYLASE in the last 168 hours. No results for input(s): AMMONIA in the last 168 hours.  CBC: Recent Labs  Lab 11/18/18 0753 11/22/18 0739  WBC 10.3 11.1*  HGB 10.4* 11.4*  HCT 32.5* 36.1*  MCV 98.8 99.7  PLT 292 280    Cardiac Enzymes: No results for input(s): CKTOTAL, CKMB, CKMBINDEX, TROPONINI in the last 168 hours.  BNP (last 3 results) No results for input(s): BNP in the last 8760 hours.  ProBNP (last 3  results) No results for input(s): PROBNP in the last 8760 hours.  Radiological Exams: No results found.  Assessment/Plan Active Problems:   Acute on chronic respiratory failure with hypoxia (HCC)   Lobar pneumonia (HCC)   Left acute arterial ischemic stroke, MCA (middle cerebral artery) (HCC)   Dysphagia   Acute metabolic encephalopathy   Acute kidney injury (AKI) with acute tubular necrosis (ATN) (HCC)   1. Acute on chronic respiratory failure with hypoxia we will continue with capping trials continue secretion management pulmonary toilet. 2. Lobar pneumonia treated improved 3. Left ischemic stroke unchanged we will continue with supportive care 4. Dysphagia speech to follow 5. Metabolic encephalopathy grossly unchanged 6. Acute kidney injury follow-up labs   I have personally seen and evaluated the patient, evaluated laboratory and imaging results, formulated the assessment and plan and placed orders. The Patient requires high complexity decision making for assessment and support.  Case was discussed on Rounds with the Respiratory Therapy Staff  Yevonne Pax, MD [facility] Pulmonary Critical Care Medicine Sleep Medicine

## 2018-11-25 ENCOUNTER — Other Ambulatory Visit (HOSPITAL_COMMUNITY): Payer: Medicare Other

## 2018-11-25 DIAGNOSIS — J9621 Acute and chronic respiratory failure with hypoxia: Secondary | ICD-10-CM | POA: Diagnosis not present

## 2018-11-25 DIAGNOSIS — R1310 Dysphagia, unspecified: Secondary | ICD-10-CM | POA: Diagnosis not present

## 2018-11-25 DIAGNOSIS — G9341 Metabolic encephalopathy: Secondary | ICD-10-CM | POA: Diagnosis not present

## 2018-11-25 DIAGNOSIS — N170 Acute kidney failure with tubular necrosis: Secondary | ICD-10-CM | POA: Diagnosis not present

## 2018-11-25 LAB — CBC
HCT: 37.8 % — ABNORMAL LOW (ref 39.0–52.0)
Hemoglobin: 12.3 g/dL — ABNORMAL LOW (ref 13.0–17.0)
MCH: 32.5 pg (ref 26.0–34.0)
MCHC: 32.5 g/dL (ref 30.0–36.0)
MCV: 99.7 fL (ref 80.0–100.0)
Platelets: 257 10*3/uL (ref 150–400)
RBC: 3.79 MIL/uL — ABNORMAL LOW (ref 4.22–5.81)
RDW: 13.9 % (ref 11.5–15.5)
WBC: 10 10*3/uL (ref 4.0–10.5)
nRBC: 0 % (ref 0.0–0.2)

## 2018-11-25 LAB — PROTIME-INR
INR: 1.54
Prothrombin Time: 18.3 seconds — ABNORMAL HIGH (ref 11.4–15.2)

## 2018-11-25 NOTE — Progress Notes (Signed)
Pulmonary Critical Care Medicine [facility] [place]   PULMONARY CRITICAL CARE SERVICE  PROGRESS NOTE  Date of Service: 11/25/2018  Brian Chung  UVO:[id number]  DOB: Aug 13, 1946   DOA: 11/07/2018  Referring Physician: Carron Curie, MD  HPI: Brian Chung is a 73 y.o. male seen for follow up of Acute on Chronic Respiratory Failure.  Patient is capping at this time has been capping for about 48 hours doing well  Medications: Reviewed on Rounds  Physical Exam:  Vitals: Temperature 98.1 pulse 60 respiratory 17 blood pressure 108/55 saturations 95%  Ventilator Settings currently is off the ventilator capping doing well  . General: Comfortable at this time . Eyes: Grossly normal lids, irises & conjunctiva . ENT: grossly tongue is normal . Neck: no obvious mass . Cardiovascular: S1 S2 normal no gallop . Respiratory: No rhonchi or rales are noted at this time . Abdomen: soft . Skin: no rash seen on limited exam . Musculoskeletal: not rigid . Psychiatric:unable to assess . Neurologic: no seizure no involuntary movements         Lab Data:   Basic Metabolic Panel: Recent Labs  Lab 11/22/18 0739  NA 137  K 4.2  CL 104  CO2 27  GLUCOSE 119*  BUN 22  CREATININE 0.93  CALCIUM 8.9  MG 2.2  PHOS 4.0    ABG: No results for input(s): PHART, PCO2ART, PO2ART, HCO3, O2SAT in the last 168 hours.  Liver Function Tests: No results for input(s): AST, ALT, ALKPHOS, BILITOT, PROT, ALBUMIN in the last 168 hours. No results for input(s): LIPASE, AMYLASE in the last 168 hours. No results for input(s): AMMONIA in the last 168 hours.  CBC: Recent Labs  Lab 11/22/18 0739 11/25/18 0609  WBC 11.1* 10.0  HGB 11.4* 12.3*  HCT 36.1* 37.8*  MCV 99.7 99.7  PLT 280 257    Cardiac Enzymes: No results for input(s): CKTOTAL, CKMB, CKMBINDEX, TROPONINI in the last 168 hours.  BNP (last 3 results) No results for input(s): BNP in the last 8760 hours.  ProBNP (last 3  results) No results for input(s): PROBNP in the last 8760 hours.  Radiological Exams: No results found.  Assessment/Plan Active Problems:   Acute on chronic respiratory failure with hypoxia (HCC)   Lobar pneumonia (HCC)   Left acute arterial ischemic stroke, MCA (middle cerebral artery) (HCC)   Dysphagia   Acute metabolic encephalopathy   Acute kidney injury (AKI) with acute tubular necrosis (ATN) (HCC)   1. Acute on chronic respiratory failure with hypoxia we will continue with capping hopefully will be decannulated soon 2. Lobar pneumonia treated 3. Acute stroke at baseline continue supportive care 4. Dysphagia followed by speech 5. Metabolic encephalopathy unchanged 6. Acute renal failure labs have been improved   I have personally seen and evaluated the patient, evaluated laboratory and imaging results, formulated the assessment and plan and placed orders. The Patient requires high complexity decision making for assessment and support.  Case was discussed on Rounds with the Respiratory Therapy Staff  Yevonne Pax, MD [facility] Pulmonary Critical Care Medicine Sleep Medicine

## 2018-11-26 DIAGNOSIS — R1310 Dysphagia, unspecified: Secondary | ICD-10-CM | POA: Diagnosis not present

## 2018-11-26 DIAGNOSIS — J9621 Acute and chronic respiratory failure with hypoxia: Secondary | ICD-10-CM | POA: Diagnosis not present

## 2018-11-26 DIAGNOSIS — N170 Acute kidney failure with tubular necrosis: Secondary | ICD-10-CM | POA: Diagnosis not present

## 2018-11-26 DIAGNOSIS — G9341 Metabolic encephalopathy: Secondary | ICD-10-CM | POA: Diagnosis not present

## 2018-11-26 LAB — PROTIME-INR
INR: 1.83
Prothrombin Time: 20.9 seconds — ABNORMAL HIGH (ref 11.4–15.2)

## 2018-11-26 LAB — BASIC METABOLIC PANEL
Anion gap: 9 (ref 5–15)
BUN: 20 mg/dL (ref 8–23)
CO2: 23 mmol/L (ref 22–32)
Calcium: 8.7 mg/dL — ABNORMAL LOW (ref 8.9–10.3)
Chloride: 104 mmol/L (ref 98–111)
Creatinine, Ser: 0.85 mg/dL (ref 0.61–1.24)
GFR calc Af Amer: >60 mL/min (ref >60)
GFR calc non Af Amer: >60 mL/min (ref >60)
Glucose, Bld: 96 mg/dL (ref 70–99)
Potassium: 4.1 mmol/L (ref 3.5–5.1)
Sodium: 136 mmol/L (ref 135–145)

## 2018-11-26 NOTE — Progress Notes (Signed)
Pulmonary Critical Care Medicine [facility] [place]   PULMONARY CRITICAL CARE SERVICE  PROGRESS NOTE  Date of Service: 11/26/2018  Brian Chung  [id number]  DOB: 1946-07-12   DOA: 11/07/2018  Referring Physician: Carron Curie, MD  HPI: Brian Chung is a 73 y.o. male seen for follow up of Acute on Chronic Respiratory Failure.  Patient is comfortable right now without distress.  Has been capping currently is on room air  Medications: Reviewed on Rounds  Physical Exam:  Vitals: Temperature 97.2 pulse 65 respiratory 18 blood pressure 93/47 saturations 95%  Ventilator Settings capping off the ventilator at this time  . General: Comfortable at this time . Eyes: Grossly normal lids, irises & conjunctiva . ENT: grossly tongue is normal . Neck: no obvious mass . Cardiovascular: S1 S2 normal no gallop . Respiratory: No rhonchi or rales are noted . Abdomen: soft . Skin: no rash seen on limited exam . Musculoskeletal: not rigid . Psychiatric:unable to assess . Neurologic: no seizure no involuntary movements         Lab Data:   Basic Metabolic Panel: Recent Labs  Lab 11/22/18 0739 11/26/18 0535  NA 137 136  K 4.2 4.1  CL 104 104  CO2 27 23  GLUCOSE 119* 96  BUN 22 20  CREATININE 0.93 0.85  CALCIUM 8.9 8.7*  MG 2.2  --   PHOS 4.0  --     ABG: No results for input(s): PHART, PCO2ART, PO2ART, HCO3, O2SAT in the last 168 hours.  Liver Function Tests: No results for input(s): AST, ALT, ALKPHOS, BILITOT, PROT, ALBUMIN in the last 168 hours. No results for input(s): LIPASE, AMYLASE in the last 168 hours. No results for input(s): AMMONIA in the last 168 hours.  CBC: Recent Labs  Lab 11/22/18 0739 11/25/18 0609  WBC 11.1* 10.0  HGB 11.4* 12.3*  HCT 36.1* 37.8*  MCV 99.7 99.7  PLT 280 257    Cardiac Enzymes: No results for input(s): CKTOTAL, CKMB, CKMBINDEX, TROPONINI in the last 168 hours.  BNP (last 3 results) No results for input(s):  BNP in the last 8760 hours.  ProBNP (last 3 results) No results for input(s): PROBNP in the last 8760 hours.  Radiological Exams: No results found.  Assessment/Plan Active Problems:   Acute on chronic respiratory failure with hypoxia (HCC)   Lobar pneumonia (HCC)   Left acute arterial ischemic stroke, MCA (middle cerebral artery) (HCC)   Dysphagia   Acute metabolic encephalopathy   Acute kidney injury (AKI) with acute tubular necrosis (ATN) (HCC)   1. Acute on chronic respiratory failure with hypoxia we will continue with capping to the 72-hour mark and then decannulate 2. Lobar pneumonia treated we will continue with supportive care 3. Acute stroke improving minimally 4. Acute metabolic encephalopathy at baseline 5. Acute renal failure improved   I have personally seen and evaluated the patient, evaluated laboratory and imaging results, formulated the assessment and plan and placed orders. The Patient requires high complexity decision making for assessment and support.  Case was discussed on Rounds with the Respiratory Therapy Staff  Yevonne Pax, MD [facility] Pulmonary Critical Care Medicine Sleep Medicine

## 2018-11-27 DIAGNOSIS — N170 Acute kidney failure with tubular necrosis: Secondary | ICD-10-CM | POA: Diagnosis not present

## 2018-11-27 DIAGNOSIS — R1310 Dysphagia, unspecified: Secondary | ICD-10-CM | POA: Diagnosis not present

## 2018-11-27 DIAGNOSIS — J9621 Acute and chronic respiratory failure with hypoxia: Secondary | ICD-10-CM | POA: Diagnosis not present

## 2018-11-27 DIAGNOSIS — G9341 Metabolic encephalopathy: Secondary | ICD-10-CM | POA: Diagnosis not present

## 2018-11-27 LAB — PROTIME-INR
INR: 2.02
Prothrombin Time: 22.6 seconds — ABNORMAL HIGH (ref 11.4–15.2)

## 2018-11-27 NOTE — Progress Notes (Signed)
Pulmonary Critical Care Medicine [facility] [place]   PULMONARY CRITICAL CARE SERVICE  PROGRESS NOTE  Date of Service: 11/27/2018  Brian Chung  [id number]  DOB: 08-08-1946   DOA: 11/07/2018  Referring Physician: Carron Curie, MD  HPI: Brian Chung is a 73 y.o. male seen for follow up of Acute on Chronic Respiratory Failure.  Patient is comfortable right now decannulated without distress afebrile  Medications: Reviewed on Rounds  Physical Exam:  Vitals: Temperature 97.7 pulse 77 respiratory 18 blood pressure 11/16/1947 saturation 95%  Ventilator Settings currently patient is off the ventilator  . General: Comfortable at this time . Eyes: Grossly normal lids, irises & conjunctiva . ENT: grossly tongue is normal . Neck: no obvious mass . Cardiovascular: S1 S2 normal no gallop . Respiratory: No rhonchi no rales are noted at this time . Abdomen: soft . Skin: no rash seen on limited exam . Musculoskeletal: not rigid . Psychiatric:unable to assess . Neurologic: no seizure no involuntary movements         Lab Data:   Basic Metabolic Panel: Recent Labs  Lab 11/22/18 0739 11/26/18 0535  NA 137 136  K 4.2 4.1  CL 104 104  CO2 27 23  GLUCOSE 119* 96  BUN 22 20  CREATININE 0.93 0.85  CALCIUM 8.9 8.7*  MG 2.2  --   PHOS 4.0  --     ABG: No results for input(s): PHART, PCO2ART, PO2ART, HCO3, O2SAT in the last 168 hours.  Liver Function Tests: No results for input(s): AST, ALT, ALKPHOS, BILITOT, PROT, ALBUMIN in the last 168 hours. No results for input(s): LIPASE, AMYLASE in the last 168 hours. No results for input(s): AMMONIA in the last 168 hours.  CBC: Recent Labs  Lab 11/22/18 0739 11/25/18 0609  WBC 11.1* 10.0  HGB 11.4* 12.3*  HCT 36.1* 37.8*  MCV 99.7 99.7  PLT 280 257    Cardiac Enzymes: No results for input(s): CKTOTAL, CKMB, CKMBINDEX, TROPONINI in the last 168 hours.  BNP (last 3 results) No results for input(s): BNP in  the last 8760 hours.  ProBNP (last 3 results) No results for input(s): PROBNP in the last 8760 hours.  Radiological Exams: No results found.  Assessment/Plan Active Problems:   Acute on chronic respiratory failure with hypoxia (HCC)   Lobar pneumonia (HCC)   Left acute arterial ischemic stroke, MCA (middle cerebral artery) (HCC)   Dysphagia   Acute metabolic encephalopathy   Acute kidney injury (AKI) with acute tubular necrosis (ATN) (HCC)   1. Acute on chronic respiratory failure with hypoxia we will continue with the oxygen as necessary titrate as tolerated 2. Lobar pneumonia treated improved 3. Acute ischemic stroke at baseline 4. Dysphagia no changes noted 5. Metabolic encephalopathy grossly unchanged 6. Acute kidney injury improved   I have personally seen and evaluated the patient, evaluated laboratory and imaging results, formulated the assessment and plan and placed orders. The Patient requires high complexity decision making for assessment and support.  Case was discussed on Rounds with the Respiratory Therapy Staff  Yevonne Pax, MD [facility] Pulmonary Critical Care Medicine Sleep Medicine

## 2018-11-28 DIAGNOSIS — N170 Acute kidney failure with tubular necrosis: Secondary | ICD-10-CM | POA: Diagnosis not present

## 2018-11-28 DIAGNOSIS — R1310 Dysphagia, unspecified: Secondary | ICD-10-CM | POA: Diagnosis not present

## 2018-11-28 DIAGNOSIS — J9621 Acute and chronic respiratory failure with hypoxia: Secondary | ICD-10-CM | POA: Diagnosis not present

## 2018-11-28 DIAGNOSIS — G9341 Metabolic encephalopathy: Secondary | ICD-10-CM | POA: Diagnosis not present

## 2018-11-28 LAB — PROTIME-INR
INR: 1.82
Prothrombin Time: 20.8 seconds — ABNORMAL HIGH (ref 11.4–15.2)

## 2018-11-28 NOTE — Progress Notes (Signed)
Pulmonary Critical Care Medicine [facility] [place]   PULMONARY CRITICAL CARE SERVICE  PROGRESS NOTE  Date of Service: 11/28/2018  Brian Chung  [id number]  DOB: [birth year]   DOA: 11/07/2018  Referring Physician: Carron Curie, MD  HPI: Brian Chung is a 73 y.o. male seen for follow up of Acute on Chronic Respiratory Failure.  Patient is decannulated on room air stoma is almost closed doing very well  Medications: Reviewed on Rounds  Physical Exam:  Vitals: Temperature 97.7 pulse 75 respiratory 20 blood pressure 101/60 saturations 95%  Ventilator Settings off the ventilator decannulated on room air  . General: Comfortable at this time . Eyes: Grossly normal lids, irises & conjunctiva . ENT: grossly tongue is normal . Neck: no obvious mass . Cardiovascular: S1 S2 normal no gallop . Respiratory: No rhonchi or rales are noted at this time . Abdomen: soft . Skin: no rash seen on limited exam . Musculoskeletal: not rigid . Psychiatric:unable to assess . Neurologic: no seizure no involuntary movements         Lab Data:   Basic Metabolic Panel: Recent Labs  Lab 11/22/18 0739 11/26/18 0535  NA 137 136  K 4.2 4.1  CL 104 104  CO2 27 23  GLUCOSE 119* 96  BUN 22 20  CREATININE 0.93 0.85  CALCIUM 8.9 8.7*  MG 2.2  --   PHOS 4.0  --     ABG: No results for input(s): PHART, PCO2ART, PO2ART, HCO3, O2SAT in the last 168 hours.  Liver Function Tests: No results for input(s): AST, ALT, ALKPHOS, BILITOT, PROT, ALBUMIN in the last 168 hours. No results for input(s): LIPASE, AMYLASE in the last 168 hours. No results for input(s): AMMONIA in the last 168 hours.  CBC: Recent Labs  Lab 11/22/18 0739 11/25/18 0609  WBC 11.1* 10.0  HGB 11.4* 12.3*  HCT 36.1* 37.8*  MCV 99.7 99.7  PLT 280 257    Cardiac Enzymes: No results for input(s): CKTOTAL, CKMB, CKMBINDEX, TROPONINI in the last 168 hours.  BNP (last 3 results) No results for input(s): BNP  in the last 8760 hours.  ProBNP (last 3 results) No results for input(s): PROBNP in the last 8760 hours.  Radiological Exams: No results found.  Assessment/Plan Active Problems:   Acute on chronic respiratory failure with hypoxia (HCC)   Lobar pneumonia (HCC)   Left acute arterial ischemic stroke, MCA (middle cerebral artery) (HCC)   Dysphagia   Acute metabolic encephalopathy   Acute kidney injury (AKI) with acute tubular necrosis (ATN) (HCC)   1. Acute on chronic respiratory failure with hypoxia stoma almost closed after decannulation we will continue to monitor use oxygen as needed 2. Lobar pneumonia treated improved 3. Ischemic stroke at baseline continue to monitor 4. Dysphagia grossly unchanged we will continue present management 5. Metabolic encephalopathy unchanged 6. Acute renal failure labs are improving   I have personally seen and evaluated the patient, evaluated laboratory and imaging results, formulated the assessment and plan and placed orders. The Patient requires high complexity decision making for assessment and support.  Case was discussed on Rounds with the Respiratory Therapy Staff  Yevonne Pax, MD [facility] Pulmonary Critical Care Medicine Sleep Medicine

## 2018-11-29 DIAGNOSIS — R1310 Dysphagia, unspecified: Secondary | ICD-10-CM | POA: Diagnosis not present

## 2018-11-29 DIAGNOSIS — N170 Acute kidney failure with tubular necrosis: Secondary | ICD-10-CM | POA: Diagnosis not present

## 2018-11-29 DIAGNOSIS — J9621 Acute and chronic respiratory failure with hypoxia: Secondary | ICD-10-CM | POA: Diagnosis not present

## 2018-11-29 DIAGNOSIS — G9341 Metabolic encephalopathy: Secondary | ICD-10-CM | POA: Diagnosis not present

## 2018-11-29 LAB — BASIC METABOLIC PANEL
ANION GAP: 10 (ref 5–15)
BUN: 22 mg/dL (ref 8–23)
CO2: 25 mmol/L (ref 22–32)
Calcium: 8.7 mg/dL — ABNORMAL LOW (ref 8.9–10.3)
Chloride: 104 mmol/L (ref 98–111)
Creatinine, Ser: 0.99 mg/dL (ref 0.61–1.24)
GFR calc non Af Amer: >60 mL/min (ref >60)
Glucose, Bld: 119 mg/dL — ABNORMAL HIGH (ref 70–99)
Potassium: 4.1 mmol/L (ref 3.5–5.1)
SODIUM: 139 mmol/L (ref 135–145)

## 2018-11-29 LAB — CBC
HCT: 39.2 % (ref 39.0–52.0)
Hemoglobin: 12.7 g/dL — ABNORMAL LOW (ref 13.0–17.0)
MCH: 32.6 pg (ref 26.0–34.0)
MCHC: 32.4 g/dL (ref 30.0–36.0)
MCV: 100.5 fL — ABNORMAL HIGH (ref 80.0–100.0)
Platelets: 204 10*3/uL (ref 150–400)
RBC: 3.9 MIL/uL — ABNORMAL LOW (ref 4.22–5.81)
RDW: 14.2 % (ref 11.5–15.5)
WBC: 8.7 10*3/uL (ref 4.0–10.5)
nRBC: 0 % (ref 0.0–0.2)

## 2018-11-29 LAB — MAGNESIUM: Magnesium: 2.2 mg/dL (ref 1.7–2.4)

## 2018-11-29 LAB — PROTIME-INR
INR: 1.75
Prothrombin Time: 20.2 seconds — ABNORMAL HIGH (ref 11.4–15.2)

## 2018-11-29 NOTE — Progress Notes (Signed)
Pulmonary Critical Care Medicine [facility] [place]   PULMONARY CRITICAL CARE SERVICE  PROGRESS NOTE  Date of Service: 11/29/2018  Brian Chung  AXE:[id number]  DOB: 10/17/1946   DOA: 11/07/2018  Referring Physician: Carron Curie, MD  HPI: Brian Chung is a 73 y.o. male seen for follow up of Acute on Chronic Respiratory Failure.  Patient is room air right now doing well decannulated no issues with stoma thus far  Medications: Reviewed on Rounds  Physical Exam:  Vitals: Temperature 97.5 pulse 69 respiratory 25 blood pressure 104/58 saturations 97%  Ventilator Settings off the ventilator  . General: Comfortable at this time . Eyes: Grossly normal lids, irises & conjunctiva . ENT: grossly tongue is normal . Neck: no obvious mass . Cardiovascular: S1 S2 normal no gallop . Respiratory: No rhonchi or rales are noted at this time . Abdomen: soft . Skin: no rash seen on limited exam . Musculoskeletal: not rigid . Psychiatric:unable to assess . Neurologic: no seizure no involuntary movements         Lab Data:   Basic Metabolic Panel: Recent Labs  Lab 11/26/18 0535 11/29/18 0536  NA 136 139  K 4.1 4.1  CL 104 104  CO2 23 25  GLUCOSE 96 119*  BUN 20 22  CREATININE 0.85 0.99  CALCIUM 8.7* 8.7*  MG  --  2.2    ABG: No results for input(s): PHART, PCO2ART, PO2ART, HCO3, O2SAT in the last 168 hours.  Liver Function Tests: No results for input(s): AST, ALT, ALKPHOS, BILITOT, PROT, ALBUMIN in the last 168 hours. No results for input(s): LIPASE, AMYLASE in the last 168 hours. No results for input(s): AMMONIA in the last 168 hours.  CBC: Recent Labs  Lab 11/25/18 0609 11/29/18 0536  WBC 10.0 8.7  HGB 12.3* 12.7*  HCT 37.8* 39.2  MCV 99.7 100.5*  PLT 257 204    Cardiac Enzymes: No results for input(s): CKTOTAL, CKMB, CKMBINDEX, TROPONINI in the last 168 hours.  BNP (last 3 results) No results for input(s): BNP in the last 8760  hours.  ProBNP (last 3 results) No results for input(s): PROBNP in the last 8760 hours.  Radiological Exams: No results found.  Assessment/Plan Active Problems:   Acute on chronic respiratory failure with hypoxia (HCC)   Lobar pneumonia (HCC)   Left acute arterial ischemic stroke, MCA (middle cerebral artery) (HCC)   Dysphagia   Acute metabolic encephalopathy   Acute kidney injury (AKI) with acute tubular necrosis (ATN) (HCC)   1. Acute on chronic respiratory failure with hypoxia resolved we will continue with supportive care patient is awaiting discharge 2. Lobar pneumonia treated resolved 3. Ischemic stroke at baseline we will continue with present management. 4. Dysphagia followed by speech 5. Acute metabolic encephalopathy he is back to baseline 6. Acute renal failure resolved   I have personally seen and evaluated the patient, evaluated laboratory and imaging results, formulated the assessment and plan and placed orders. The Patient requires high complexity decision making for assessment and support.  Case was discussed on Rounds with the Respiratory Therapy Staff  Yevonne Pax, MD [facility] Pulmonary Critical Care Medicine Sleep Medicine

## 2018-11-30 LAB — PROTIME-INR
INR: 1.9
Prothrombin Time: 21.5 seconds — ABNORMAL HIGH (ref 11.4–15.2)

## 2018-12-01 LAB — PROTIME-INR
INR: 1.53
Prothrombin Time: 18.2 seconds — ABNORMAL HIGH (ref 11.4–15.2)

## 2018-12-02 LAB — BASIC METABOLIC PANEL
Anion gap: 6 (ref 5–15)
BUN: 25 mg/dL — ABNORMAL HIGH (ref 8–23)
CO2: 29 mmol/L (ref 22–32)
CREATININE: 0.87 mg/dL (ref 0.61–1.24)
Calcium: 8.5 mg/dL — ABNORMAL LOW (ref 8.9–10.3)
Chloride: 105 mmol/L (ref 98–111)
GFR calc non Af Amer: >60 mL/min (ref >60)
Glucose, Bld: 114 mg/dL — ABNORMAL HIGH (ref 70–99)
Potassium: 4.2 mmol/L (ref 3.5–5.1)
Sodium: 140 mmol/L (ref 135–145)

## 2018-12-02 LAB — PROTIME-INR
INR: 1.23
PROTHROMBIN TIME: 15.4 s — AB (ref 11.4–15.2)

## 2018-12-02 LAB — CBC
HCT: 38.7 % — ABNORMAL LOW (ref 39.0–52.0)
Hemoglobin: 12.2 g/dL — ABNORMAL LOW (ref 13.0–17.0)
MCH: 32.2 pg (ref 26.0–34.0)
MCHC: 31.5 g/dL (ref 30.0–36.0)
MCV: 102.1 fL — ABNORMAL HIGH (ref 80.0–100.0)
Platelets: 160 10*3/uL (ref 150–400)
RBC: 3.79 MIL/uL — ABNORMAL LOW (ref 4.22–5.81)
RDW: 14.1 % (ref 11.5–15.5)
WBC: 7.8 10*3/uL (ref 4.0–10.5)
nRBC: 0 % (ref 0.0–0.2)

## 2018-12-02 LAB — MAGNESIUM: Magnesium: 1.9 mg/dL (ref 1.7–2.4)

## 2018-12-03 LAB — PROTIME-INR
INR: 1.54
Prothrombin Time: 18.3 seconds — ABNORMAL HIGH (ref 11.4–15.2)

## 2018-12-04 LAB — BASIC METABOLIC PANEL
Anion gap: 9 (ref 5–15)
BUN: 18 mg/dL (ref 8–23)
CO2: 26 mmol/L (ref 22–32)
CREATININE: 0.74 mg/dL (ref 0.61–1.24)
Calcium: 8.8 mg/dL — ABNORMAL LOW (ref 8.9–10.3)
Chloride: 103 mmol/L (ref 98–111)
GFR calc Af Amer: >60 mL/min (ref >60)
Glucose, Bld: 111 mg/dL — ABNORMAL HIGH (ref 70–99)
Potassium: 4.1 mmol/L (ref 3.5–5.1)
Sodium: 138 mmol/L (ref 135–145)

## 2018-12-04 LAB — MAGNESIUM: Magnesium: 2 mg/dL (ref 1.7–2.4)

## 2018-12-04 LAB — CBC
HCT: 41.7 % (ref 39.0–52.0)
Hemoglobin: 13 g/dL (ref 13.0–17.0)
MCH: 31.4 pg (ref 26.0–34.0)
MCHC: 31.2 g/dL (ref 30.0–36.0)
MCV: 100.7 fL — ABNORMAL HIGH (ref 80.0–100.0)
PLATELETS: 160 10*3/uL (ref 150–400)
RBC: 4.14 MIL/uL — ABNORMAL LOW (ref 4.22–5.81)
RDW: 14 % (ref 11.5–15.5)
WBC: 6.2 10*3/uL (ref 4.0–10.5)
nRBC: 0 % (ref 0.0–0.2)

## 2018-12-04 LAB — PROTIME-INR
INR: 1.89
Prothrombin Time: 21.5 seconds — ABNORMAL HIGH (ref 11.4–15.2)

## 2019-10-14 DEATH — deceased

## 2020-08-09 IMAGING — DX DG CHEST 1V PORT
1 series · 1 of 1 positions shown · non-contrast
Comparison: None.

CLINICAL DATA: Pneumonia, confirm tracheostomy location after
recent transfer.

EXAM:
PORTABLE CHEST 1 VIEW

[chest ap]
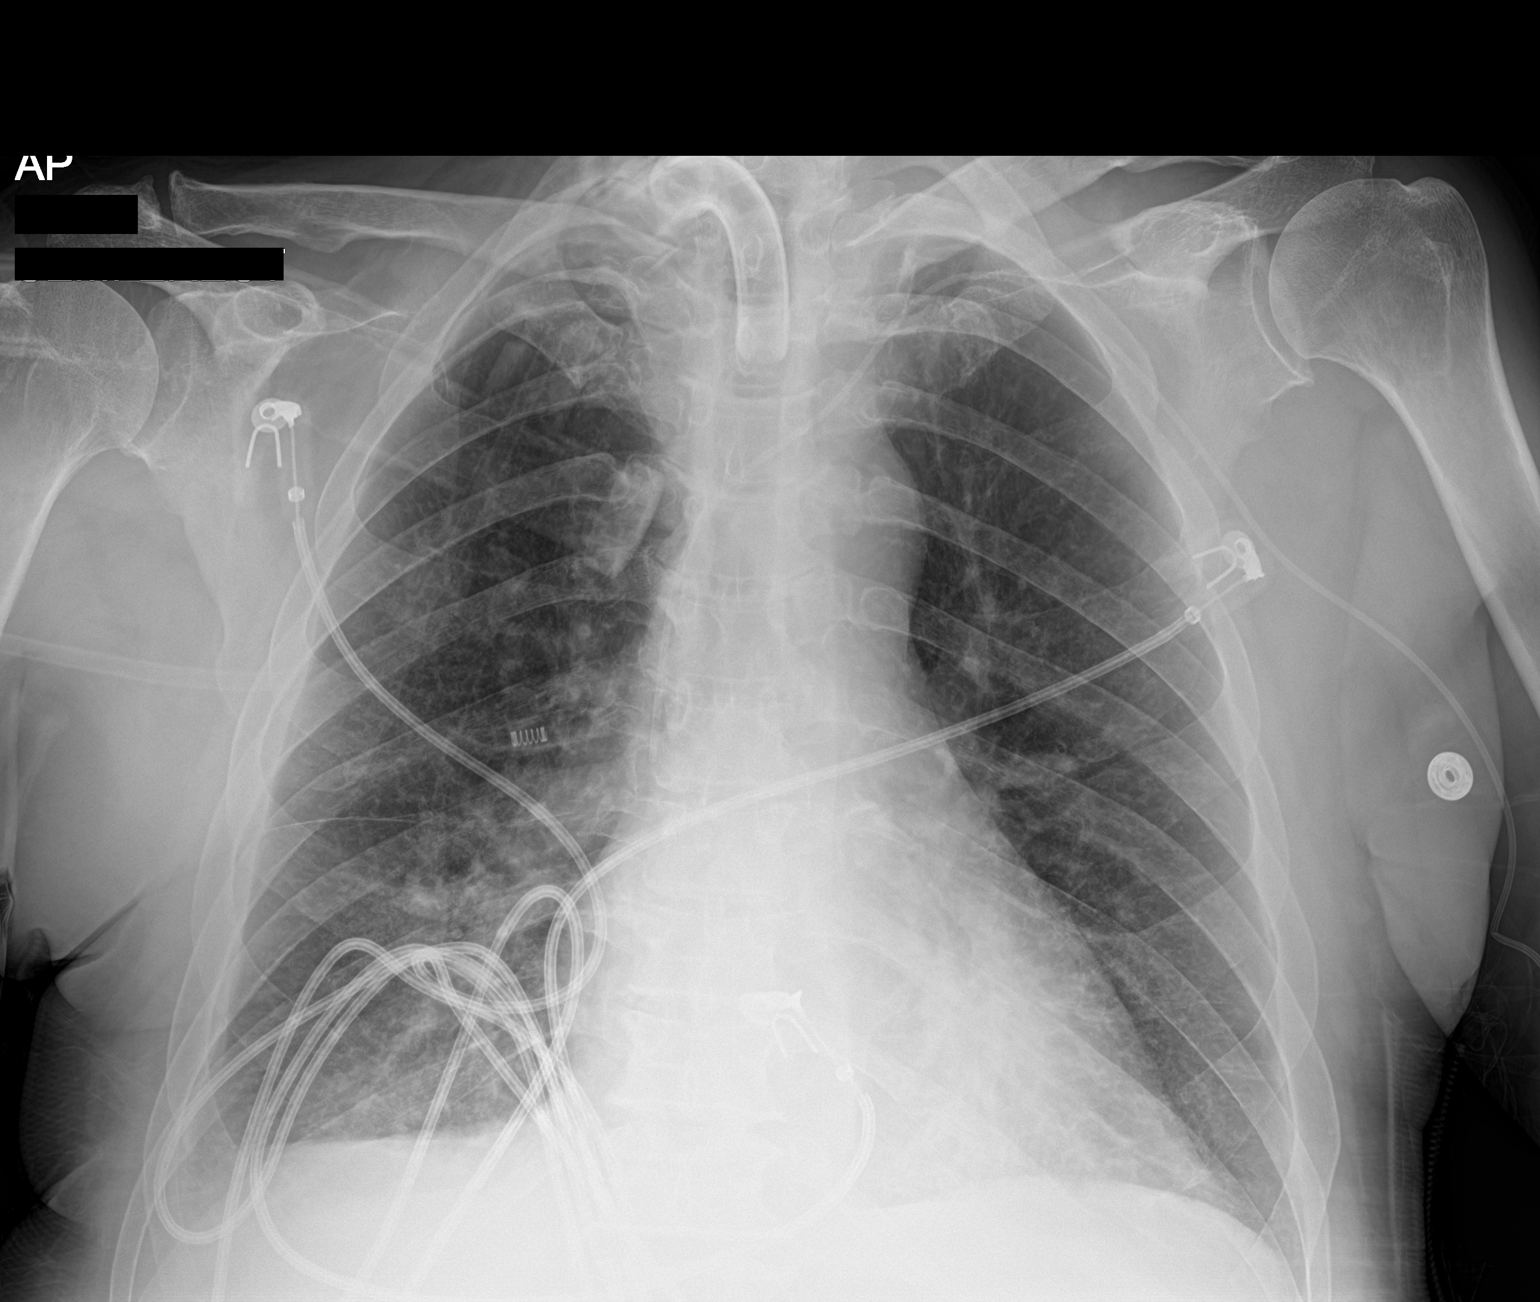

[1 of 1 positions shown; findings below may reference images not displayed]

FINDINGS: The heart size and mediastinal contours are within normal limits.
Tip of tracheostomy tube is centered over the upper third of the
thoracic trachea. The lungs are hyperinflated and there are
retrocardiac streaky opacities suspicious for atelectasis and/or
minimal pneumonia. Trace right effusion is not excluded accounting
for subtle opacity blunting the right lateral costophrenic angle. No
overt pulmonary edema or pneumothorax. The visualized skeletal
structures are unremarkable.
IMPRESSION: Hyperinflated lungs with retrocardiac atelectasis and/or minimal
pneumonia. Tracheostomy tube tip in satisfactory position centered
over the upper third of the trachea.
